# Patient Record
Sex: Female | Born: 1980 | Race: White | Hispanic: No | Marital: Single | Smoking: Former smoker
Health system: Southern US, Community
[De-identification: ages and names within clinical notes are randomized; demographics above are authoritative.]

## PROBLEM LIST (undated history)

## (undated) DIAGNOSIS — D689 Coagulation defect, unspecified: Secondary | ICD-10-CM

## (undated) DIAGNOSIS — Z720 Tobacco use: Secondary | ICD-10-CM

## (undated) DIAGNOSIS — F419 Anxiety disorder, unspecified: Secondary | ICD-10-CM

## (undated) DIAGNOSIS — T7840XA Allergy, unspecified, initial encounter: Secondary | ICD-10-CM

## (undated) DIAGNOSIS — Q400 Congenital hypertrophic pyloric stenosis: Secondary | ICD-10-CM

## (undated) DIAGNOSIS — Z5189 Encounter for other specified aftercare: Secondary | ICD-10-CM

## (undated) HISTORY — DX: Allergy, unspecified, initial encounter: T78.40XA

## (undated) HISTORY — DX: Congenital hypertrophic pyloric stenosis: Q40.0

## (undated) HISTORY — DX: Coagulation defect, unspecified: D68.9

## (undated) HISTORY — DX: Encounter for other specified aftercare: Z51.89

## (undated) HISTORY — DX: Anxiety disorder, unspecified: F41.9

## (undated) HISTORY — PX: ABDOMINAL SURGERY: SHX537

## (undated) HISTORY — DX: Tobacco use: Z72.0

---

## 2006-12-29 ENCOUNTER — Ambulatory Visit: Payer: Self-pay | Admitting: Sports Medicine

## 2006-12-29 DIAGNOSIS — M25569 Pain in unspecified knee: Secondary | ICD-10-CM

## 2007-01-24 ENCOUNTER — Ambulatory Visit: Payer: Self-pay | Admitting: Sports Medicine

## 2011-02-15 ENCOUNTER — Ambulatory Visit: Payer: Self-pay | Admitting: Physician Assistant

## 2011-04-21 ENCOUNTER — Ambulatory Visit
Admission: RE | Admit: 2011-04-21 | Discharge: 2011-04-21 | Disposition: A | Discharge status: Home / Self Care | Payer: 59 | Source: Ambulatory Visit | Attending: Family Medicine | Admitting: Family Medicine

## 2011-04-21 ENCOUNTER — Ambulatory Visit (INDEPENDENT_AMBULATORY_CARE_PROVIDER_SITE_OTHER): Payer: 59 | Admitting: Family Medicine

## 2011-04-21 VITALS — BP 112/72 | HR 85 | Temp 98.4°F | Resp 16 | Ht 66.75 in | Wt 152.2 lb

## 2011-04-21 DIAGNOSIS — N949 Unspecified condition associated with female genital organs and menstrual cycle: Secondary | ICD-10-CM

## 2011-04-21 DIAGNOSIS — R1032 Left lower quadrant pain: Secondary | ICD-10-CM

## 2011-04-21 LAB — POCT UA - MICROSCOPIC ONLY

## 2011-04-21 LAB — POCT URINALYSIS DIPSTICK
Bilirubin, UA: NEGATIVE
Ketones, UA: NEGATIVE
Leukocytes, UA: NEGATIVE
Spec Grav, UA: 1.02

## 2011-04-21 LAB — POCT URINE PREGNANCY: Preg Test, Ur: NEGATIVE

## 2011-04-21 NOTE — Progress Notes (Signed)
  Subjective:    Patient ID: Cheryl Hale, female    DOB: 05-09-1980, 31 y.o.   MRN: [id number]  Abdominal Pain This is a new problem. The current episode started today (started at [facility] The onset quality is sudden. The problem occurs constantly (worse with position change). The problem has been unchanged. The pain is located in the LLQ. The pain is at a severity of 4/10. The pain is moderate. The quality of the pain is aching. The abdominal pain does not radiate. Pertinent negatives include no constipation, dysuria, fever, hematuria or vomiting.   No history of ovarian cysts, nephrolithiasis or constipation   Review of Systems  Constitutional: Negative for fever, chills and appetite change.  Gastrointestinal: Positive for abdominal pain. Negative for vomiting and constipation.  Genitourinary: Positive for pelvic pain. Negative for dysuria, hematuria, flank pain, vaginal bleeding, vaginal discharge, vaginal pain and menstrual problem (on ocp).       Objective:   Physical Exam  Constitutional: She appears well-developed and well-nourished.  Neck: Neck supple.  Cardiovascular: Normal rate, regular rhythm and normal heart sounds.   Pulmonary/Chest: Effort normal and breath sounds normal.  Abdominal: There is tenderness (LLQ).  Genitourinary: Uterus normal. Cervix exhibits no motion tenderness. Right adnexum displays no mass. Left adnexum displays tenderness and fullness. Left adnexum displays no mass.  Skin: Skin is warm.  Psychiatric: She has a normal mood and affect.            Assessment & Plan:  (R) Lower quadrant pelvis pain, suspect ruptured cyst  Pelvic U/S; follow up after U/S complete Anticipatory guidance

## 2011-04-22 ENCOUNTER — Telehealth: Payer: Self-pay | Admitting: Family Medicine

## 2011-04-22 NOTE — Telephone Encounter (Signed)
Please see the result note.  The [place] was normal.

## 2011-04-22 NOTE — Telephone Encounter (Signed)
Pt states that she had an ultrasound yesterday and would like to know the results.  Please call.

## 2011-04-22 NOTE — Telephone Encounter (Signed)
Please review ultrasound pelvis and transvaginal results

## 2011-04-23 NOTE — Telephone Encounter (Signed)
Pt notified of result patient is feeling slightly better still in discomfort. Advised if not improving or gets worse to RTC. Pt agreed and understood.

## 2012-04-14 IMAGING — US US PELVIS COMPLETE
1 series · 14 of 25 positions shown · non-contrast
Comparison: None.

CLINICAL DATA: The left lower quadrant pain

TRANSABDOMINAL AND TRANSVAGINAL ULTRASOUND OF PELVIS
TECHNIQUE: Both transabdominal and transvaginal ultrasound
examinations of the pelvis were performed. Transabdominal technique
was performed for global imaging of the pelvis including uterus,
ovaries, adnexal regions, and pelvic cul-de-sac.

[Series 1: us pelvis complete · 0.19mm/px · 14 of 36 slices shown]
[im 1/36]
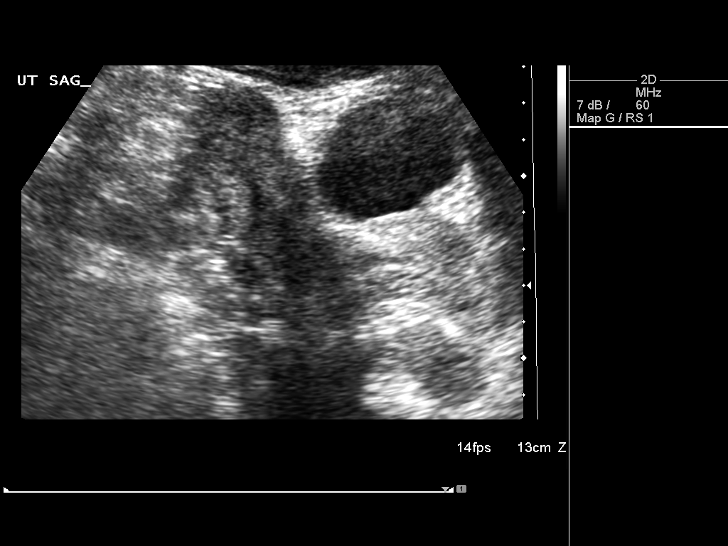
[im 3/36]
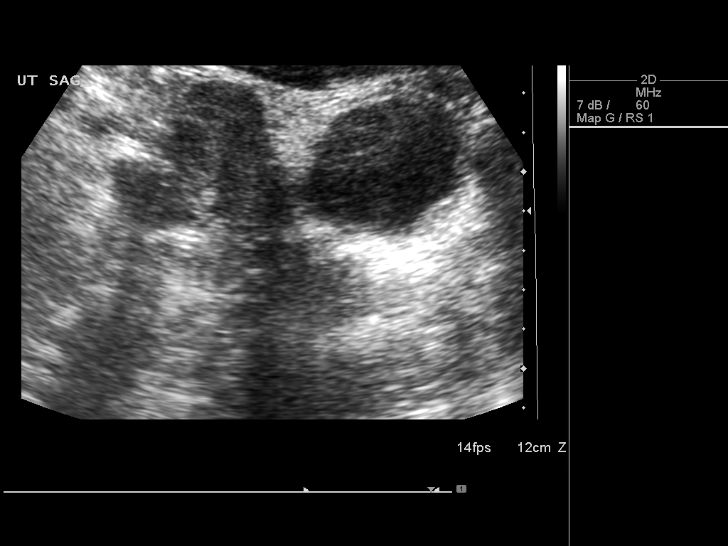
[im 6/36]
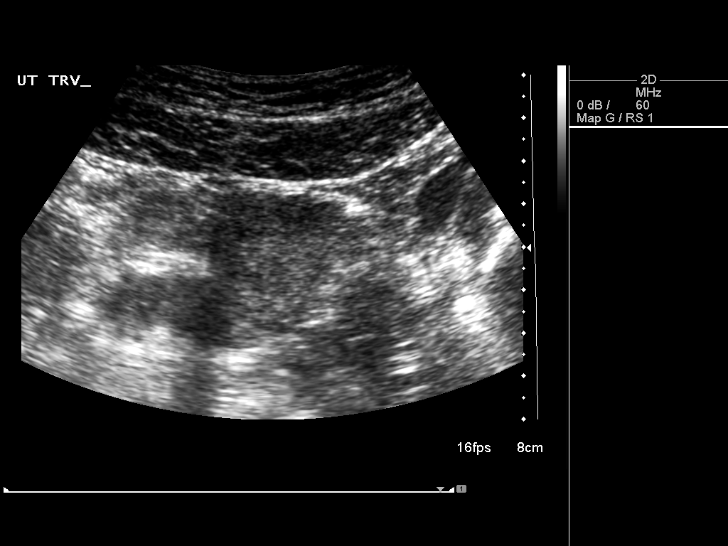
[im 9/36]
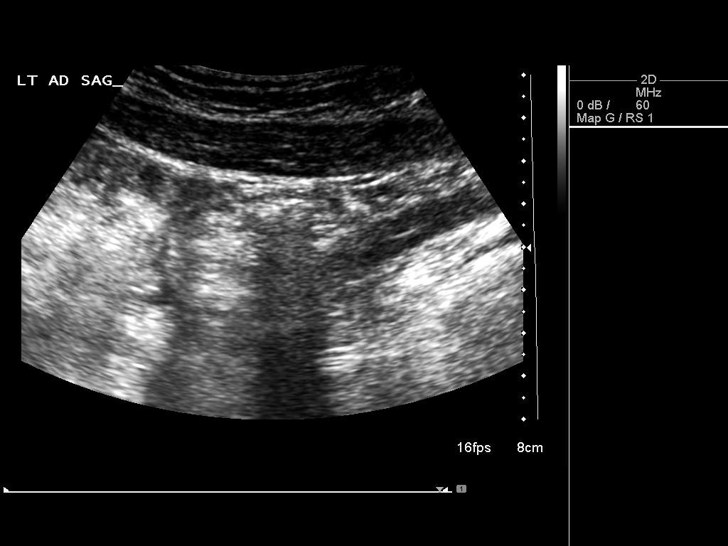
[im 12/36]
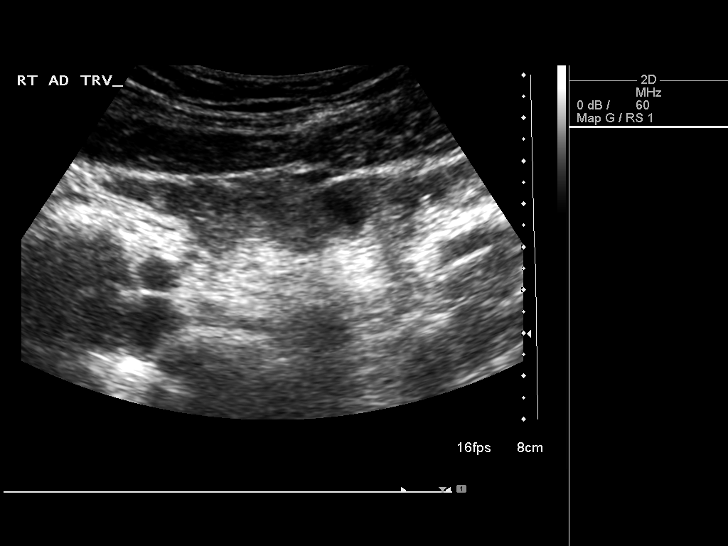
[im 14/36]
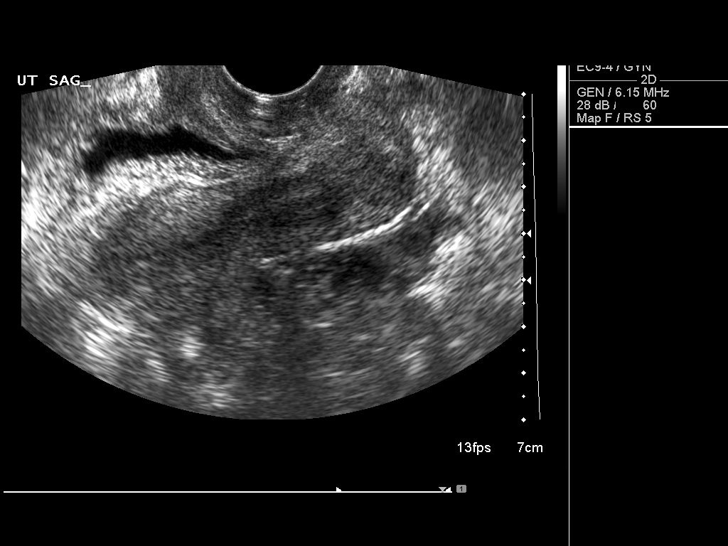
[im 17/36]
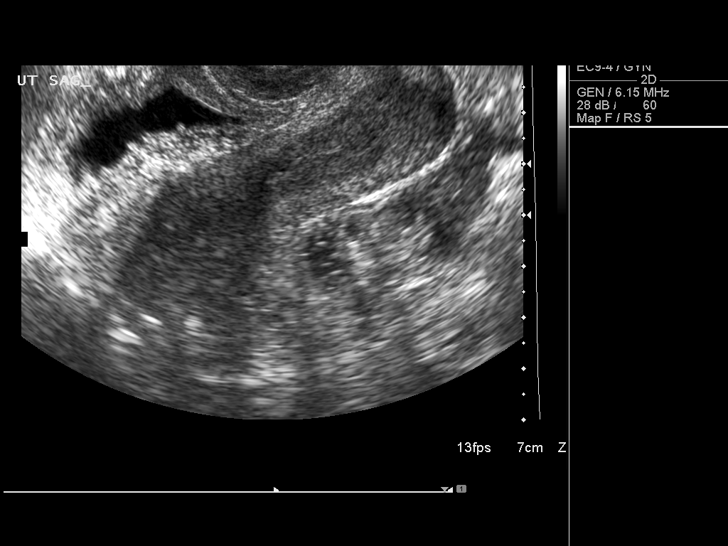
[im 19/36]
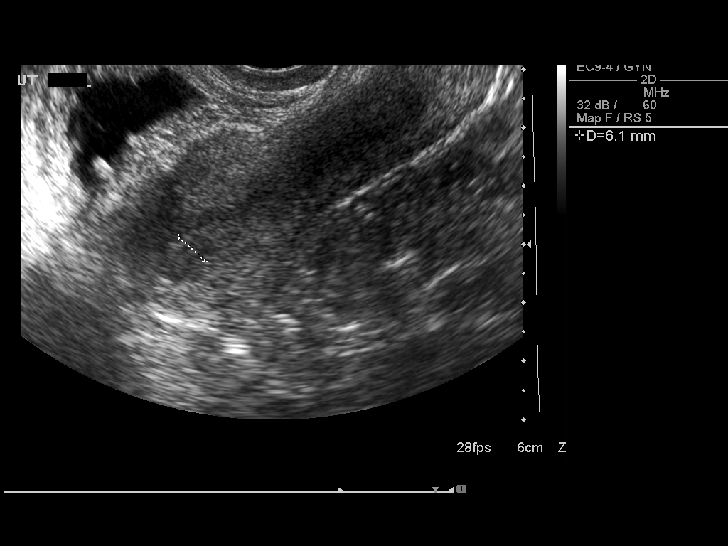
[im 22/36]
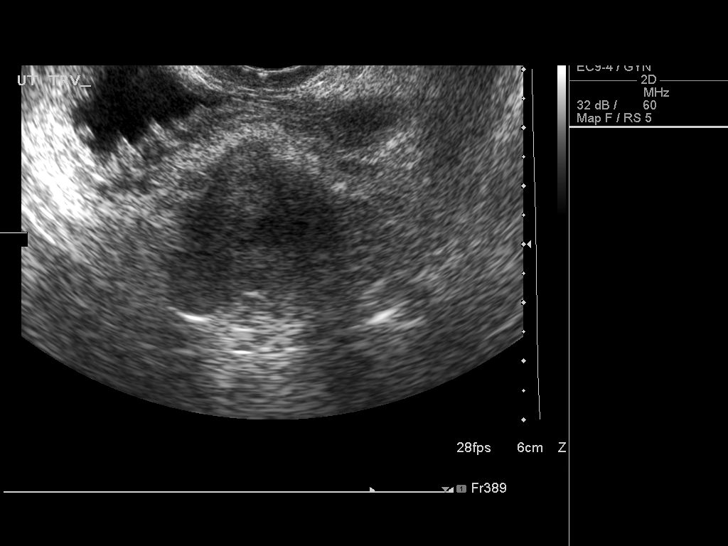
[im 24/36]
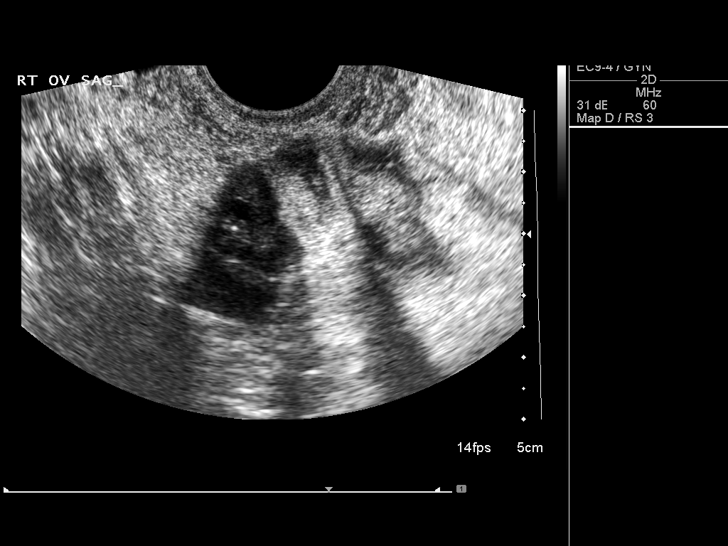
[im 27/36]
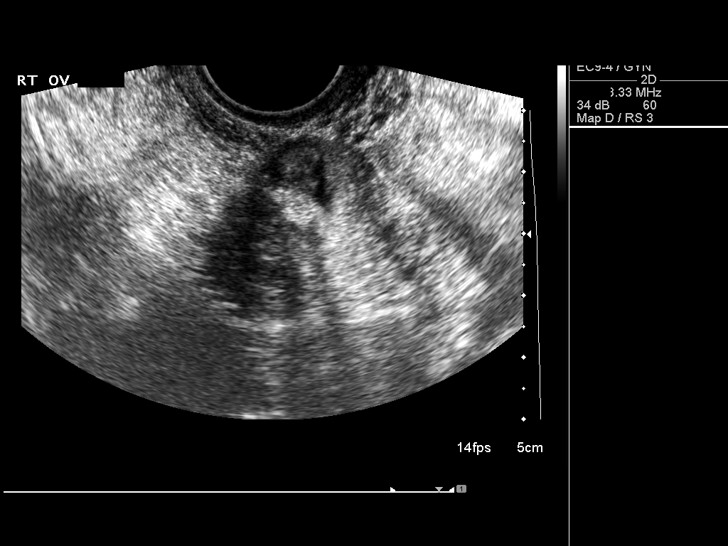
[im 30/36]
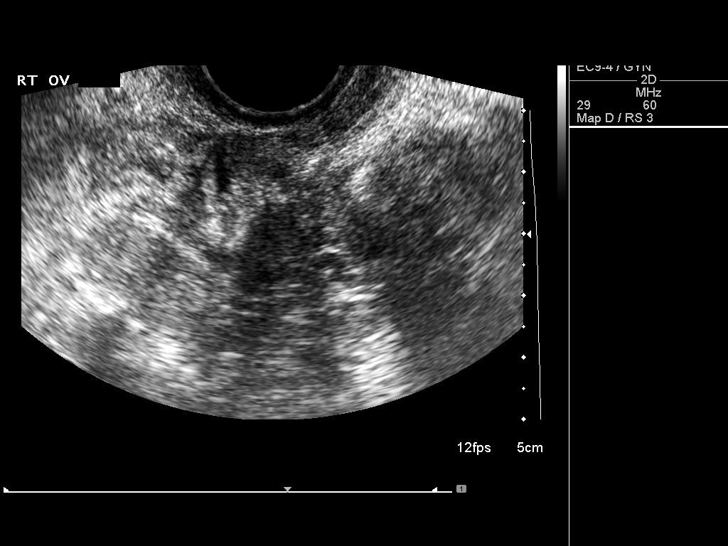
[im 33/36]
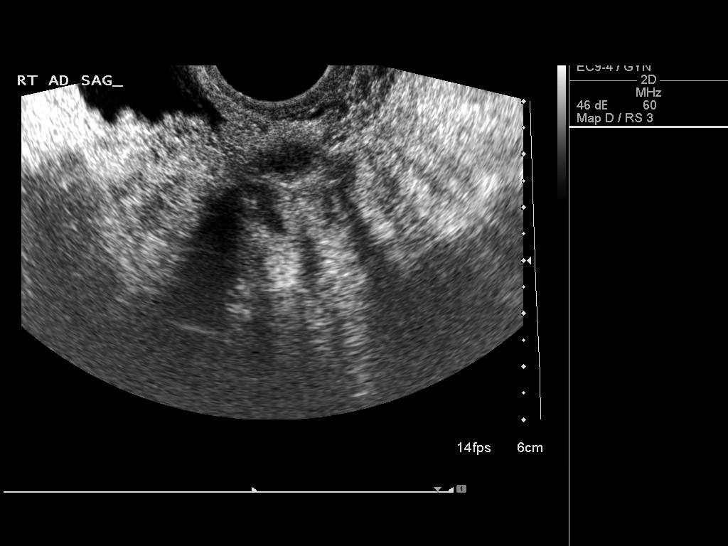
[im 36/36]
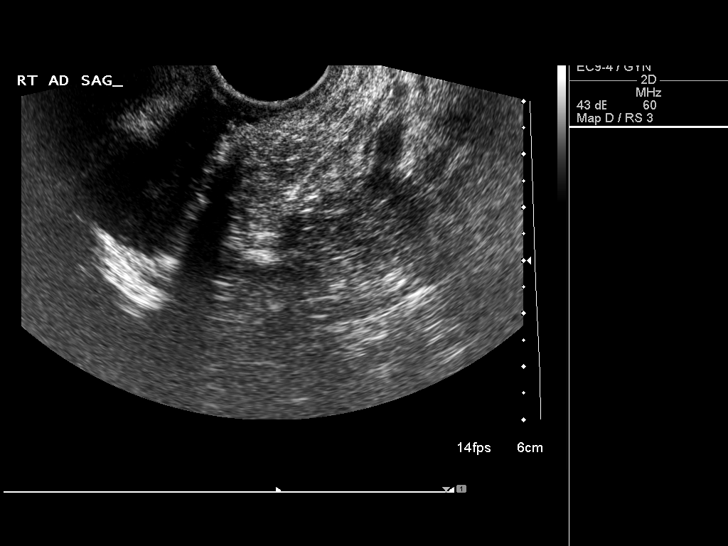

[14 of 25 positions shown; findings below may reference images not displayed]

It was necessary to proceed with endovaginal exam following the
transabdominal exam to visualize the adnexa.
FINDINGS: The uterus is normal in size and echotexture, measuring 7.8 x 2.9 x
3.0 cm.  The endometrial stripe is thin and homogeneous, measuring
6 mm in width.

The right ovary has a normal size and appearance, measuring 2.7 x
1.4 x 2.5 cm.  The left ovary is not identified.  There are no
adnexal masses or free pelvic fluid.
IMPRESSION: Normal pelvic ultrasound..

## 2012-08-22 DIAGNOSIS — J309 Allergic rhinitis, unspecified: Secondary | ICD-10-CM | POA: Insufficient documentation

## 2012-08-22 DIAGNOSIS — N943 Premenstrual tension syndrome: Secondary | ICD-10-CM | POA: Insufficient documentation

## 2012-08-22 DIAGNOSIS — D239 Other benign neoplasm of skin, unspecified: Secondary | ICD-10-CM | POA: Insufficient documentation

## 2012-08-22 DIAGNOSIS — B009 Herpesviral infection, unspecified: Secondary | ICD-10-CM | POA: Insufficient documentation

## 2012-11-07 ENCOUNTER — Ambulatory Visit (INDEPENDENT_AMBULATORY_CARE_PROVIDER_SITE_OTHER): Payer: 59 | Admitting: Family Medicine

## 2012-11-07 VITALS — BP 100/64 | HR 90 | Temp 99.2°F | Resp 18 | Ht 67.0 in | Wt 152.0 lb

## 2012-11-07 DIAGNOSIS — J020 Streptococcal pharyngitis: Secondary | ICD-10-CM

## 2012-11-07 DIAGNOSIS — J029 Acute pharyngitis, unspecified: Secondary | ICD-10-CM

## 2012-11-07 DIAGNOSIS — R509 Fever, unspecified: Secondary | ICD-10-CM

## 2012-11-07 MED ORDER — PENICILLIN V POTASSIUM 500 MG PO TABS: 500.0000 mg | ORAL_TABLET | Freq: Two times a day (BID) | ORAL | Status: DC

## 2012-11-07 MED ORDER — IBUPROFEN 100 MG/5ML PO SUSP: 400.0000 mg | Freq: Once | ORAL | Status: DC

## 2012-11-07 NOTE — Progress Notes (Signed)
[facility] [address], [place] [address] [phone]  Date:  11/07/2012   Name:  Cheryl Hale   DOB:  07-Aug-1980   MRN:  [id number]  PCP:  No PCP Per Patient    Chief Complaint: Sore Throat and Fever   History of Present Illness:  Cheryl Hale is a 32 y.o. very pleasant female patient who presents with the following:  She is here today wit ha ST. She first noted this yesterday nad today it is worse.  She feels pressure in her teeth and ears, and her cervical nodes are sore.    She has noted some possible PND, but not a cough. She was not aware of any fever- yesterdya her temp was normal at home.   She has not noted any body aches or chills, but she does feel "kind of sore.'   No GI symptoms.    She is on OCP, expects her menses later this week.    Patient Active Problem List   Diagnosis Date Noted  . 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 Allergy   . Anxiety     No past surgical history on file.  History  Substance Use Topics  . Smoking status: Current Every Day Smoker  . Smokeless tobacco: Not on file  . Alcohol Use: Not on file    Family History  Problem Relation Age of Onset  . Hypertension Mother   . Diabetes Father   . Cancer Maternal Grandfather   . Hypertension Maternal Grandfather     Allergies  Allergen Reactions  . Sulfa Antibiotics Other (See Comments)    childhood    Medication list has been reviewed and updated.  Current Outpatient Prescriptions on File Prior to Visit  Medication Sig Dispense Refill  . ALPRAZolam (XANAX) 0.5 MG tablet Take 0.5 mg by mouth as needed.      . drospirenone-ethinyl estradiol (YAZ,GIANVI,LORYNA) 3-0.02 MG tablet Take 1 tablet by mouth daily.       No current facility-administered medications on file prior to visit.    Review of Systems:  As per HPI- otherwise negative.   Physical Examination: Filed Vitals:   11/07/12 0841  BP:  100/64  Pulse: 104  Temp: 99.5 F (37.5 C)  Resp: 18   Filed Vitals:   11/07/12 0841  Height: 5\' 7"  (1.702 m)  Weight: 152 lb (68.947 kg)   Body mass index is 23.8 kg/(m^2). Ideal Body Weight: Weight in (lb) to have BMI = 25: 159.3  GEN: WDWN, NAD, Non-toxic, A & O x 3 HEENT: Atraumatic, Normocephalic. Neck supple. No masses, small tender cervical nodes palpble.  Bilateral TM wnl, oropharynx injected with mild exudate bilaterally, no abscess apparent.  Uvula midline.  PEERL,EOMI.   Ears and Nose: No external deformity. CV: RRR, No M/G/R. No JVD. No thrill. No extra heart sounds. PULM: CTA B, no wheezes, crackles, rhonchi. No retractions. No resp. distress. No accessory muscle use. ABD: S, NT, ND EXTR: No c/c/e NEURO Normal gait.  PSYCH: Normally interactive. Conversant. Not depressed or anxious appearing.  Calm demeanor.   Results for orders placed in visit on 11/07/12  POCT RAPID STREP A (OFFICE)      Result Value Range   Rapid Strep A Screen Positive (*) Negative   Given ibuprofen by mouth and tachycardia resolved   Assessment and Plan: Streptococcal sore throat - Plan: penicillin v potassium (VEETID) 500 MG tablet  Acute pharyngitis - Plan: POCT rapid strep A  Fever, unspecified - Plan: ibuprofen (ADVIL,MOTRIN) 100 MG/5ML suspension 400 mg  Treat for strep pharyngitis with penicillin.  Follow-up if not better soon, Sooner if worse.     Signed Abbe Amsterdam, MD

## 2012-11-07 NOTE — Patient Instructions (Signed)
Seen today with strep throat.  She cannot return to work or school until she has been on treatment for 24 hours.    Please let me know if you are not feeling better in the next day or two.  Drink plenty of fluids, rest, and use OTC pain relievers and throat spray as needed.

## 2012-11-16 ENCOUNTER — Ambulatory Visit: Payer: 59 | Admitting: Neurology

## 2012-12-18 ENCOUNTER — Encounter: Payer: Self-pay | Admitting: Physician Assistant

## 2012-12-18 ENCOUNTER — Ambulatory Visit (INDEPENDENT_AMBULATORY_CARE_PROVIDER_SITE_OTHER): Payer: 59 | Admitting: Physician Assistant

## 2012-12-18 ENCOUNTER — Telehealth: Payer: Self-pay | Admitting: *Deleted

## 2012-12-18 VITALS — BP 96/60 | HR 63 | Temp 98.6°F | Resp 16 | Ht 67.0 in | Wt 149.6 lb

## 2012-12-18 DIAGNOSIS — Z862 Personal history of diseases of the blood and blood-forming organs and certain disorders involving the immune mechanism: Secondary | ICD-10-CM

## 2012-12-18 DIAGNOSIS — N946 Dysmenorrhea, unspecified: Secondary | ICD-10-CM

## 2012-12-18 DIAGNOSIS — F17200 Nicotine dependence, unspecified, uncomplicated: Secondary | ICD-10-CM

## 2012-12-18 DIAGNOSIS — Z720 Tobacco use: Secondary | ICD-10-CM

## 2012-12-18 DIAGNOSIS — F411 Generalized anxiety disorder: Secondary | ICD-10-CM

## 2012-12-18 DIAGNOSIS — Z0000 Encounter for general adult medical examination without abnormal findings: Secondary | ICD-10-CM

## 2012-12-18 LAB — CBC WITH DIFFERENTIAL/PLATELET
Basophils Absolute: 0 10*3/uL (ref 0.0–0.1)
Basophils Relative: 0 % (ref 0–1)
Eosinophils Absolute: 0.1 10*3/uL (ref 0.0–0.7)
Eosinophils Relative: 1 % (ref 0–5)
HCT: 39 % (ref 36.0–46.0)
Hemoglobin: 13.4 g/dL (ref 12.0–15.0)
Lymphocytes Relative: 39 % (ref 12–46)
Lymphs Abs: 2.9 10*3/uL (ref 0.7–4.0)
MCH: 31.5 pg (ref 26.0–34.0)
MCHC: 34.4 g/dL (ref 30.0–36.0)
MCV: 91.5 fL (ref 78.0–100.0)
Monocytes Absolute: 0.6 10*3/uL (ref 0.1–1.0)
Monocytes Relative: 9 % (ref 3–12)
Neutro Abs: 3.7 10*3/uL (ref 1.7–7.7)
Neutrophils Relative %: 51 % (ref 43–77)
Platelets: 274 10*3/uL (ref 150–400)
RBC: 4.26 MIL/uL (ref 3.87–5.11)
RDW: 12.7 % (ref 11.5–15.5)
WBC: 7.3 10*3/uL (ref 4.0–10.5)

## 2012-12-18 LAB — POCT URINALYSIS DIPSTICK
Bilirubin, UA: NEGATIVE
Glucose, UA: NEGATIVE
Ketones, UA: NEGATIVE
Leukocytes, UA: NEGATIVE
Spec Grav, UA: 1.015

## 2012-12-18 LAB — COMPREHENSIVE METABOLIC PANEL WITH GFR
ALT: 13 U/L (ref 0–35)
AST: 18 U/L (ref 0–37)
Albumin: 3.3 g/dL — ABNORMAL LOW (ref 3.5–5.2)
Alkaline Phosphatase: 13 U/L — ABNORMAL LOW (ref 39–117)
BUN: 18 mg/dL (ref 6–23)
CO2: 26 meq/L (ref 19–32)
Calcium: 8.7 mg/dL (ref 8.4–10.5)
Chloride: 105 meq/L (ref 96–112)
Creat: 0.92 mg/dL (ref 0.50–1.10)
Glucose, Bld: 81 mg/dL (ref 70–99)
Potassium: 4.5 meq/L (ref 3.5–5.3)
Sodium: 139 meq/L (ref 135–145)
Total Bilirubin: 0.4 mg/dL (ref 0.3–1.2)
Total Protein: 6.7 g/dL (ref 6.0–8.3)

## 2012-12-18 LAB — POCT UA - MICROSCOPIC ONLY
Bacteria, U Microscopic: NEGATIVE
WBC, Ur, HPF, POC: NEGATIVE

## 2012-12-18 MED ORDER — DROSPIRENONE-ETHINYL ESTRADIOL 3-0.02 MG PO TABS: 1.0000 | ORAL_TABLET | Freq: Every day | ORAL | Status: DC

## 2012-12-18 MED ORDER — ALPRAZOLAM 0.5 MG PO TABS: 0.5000 mg | ORAL_TABLET | ORAL | Status: DC | PRN

## 2012-12-18 NOTE — Telephone Encounter (Signed)
Called patient, I left a message to let her know we forgot to give her the AVS and prescription. She called back and I told her to pick up  prescription/AVS at [address] today.

## 2012-12-18 NOTE — Progress Notes (Signed)
Subjective:    Patient ID: Cheryl Hale, female    DOB: [birth year], 32 y.o.   MRN: [id number]  HPI This 32 y.o. female presents for Annual Wellness Exam.  Last exam here was in 2012.  CPE in 2013 was performed by Elpidio Anis, PA-C at [facility].  As Ms. Wallace Cullens has left that practice, Ms. Steines has returned here for primary care.  Last pap 01/20/2010 was normal, and HPV at that time was negative.  Current recommendations are that we repeat both in 5 years.    Active Ambulatory Problems    Diagnosis Date Noted  . 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 Tobacco abuse 12/18/2012   Resolved Ambulatory Problems    Diagnosis Date Noted  . No Resolved Ambulatory Problems   Past Medical History  Diagnosis Date  . Allergy   . Anxiety   . Blood transfusion without reported diagnosis   . Clotting disorder   . Pyloric stenosis, congenital     Past Surgical History  Procedure Laterality Date  . Abdominal surgery  1980    pyloric stenosis    Allergies  Allergen Reactions  . Sulfa Antibiotics Other (See Comments)    childhood    Prior to Admission medications   Medication Sig Start Date End Date Taking? Authorizing Provider  ALPRAZolam Prudy Feeler) 0.5 MG tablet Take 1 tablet (0.5 mg total) by mouth as needed for anxiety. 12/18/12  Yes Verl Whitmore S Shadeed Colberg, PA-C  drospirenone-ethinyl estradiol (YAZ,GIANVI,LORYNA) 3-0.02 MG tablet Take 1 tablet by mouth daily. 12/18/12  Yes Saory Carriero S Adajah Cocking, PA-C  mometasone (NASONEX) 50 MCG/ACT nasal spray Place 2 sprays into the nose daily.   Yes Historical Provider, MD    History   Social History  . Marital Status: Single    Spouse Name: n/a    Number of Children: 0  . Years of Education: college   Occupational History  . HR Coordinator [employer]   Social History Main Topics  . Smoking status: Current Every Day Smoker -- 0.30 packs/day    Types: Cigarettes  . Smokeless tobacco: None  . Alcohol Use: 1.0 oz/week    2 drink(s) per  week  . Drug Use: No  . Sexual Activity: Not Currently   Other Topics Concern  . None   Social History Narrative   Lives alone. Exercise: kickbox 1-2 hours 4-5 times a week.    family history includes Cancer in her maternal grandfather, maternal grandmother, and paternal grandfather; Diabetes in her father; Hypertension in her maternal grandfather and mother. indicated that her mother is alive. She indicated that her father is alive. She indicated that her brother is alive. She indicated that her maternal grandmother is deceased. She indicated that her maternal grandfather is alive. She indicated that her paternal grandmother is alive. She indicated that her paternal grandfather is deceased.      Review of Systems  Constitutional: Negative.   HENT: Negative.   Eyes: Negative.   Respiratory: Negative.   Cardiovascular: Negative.   Gastrointestinal: Negative.   Endocrine: Negative.   Genitourinary: Negative.   Musculoskeletal: Negative.   Skin: Negative.   Allergic/Immunologic: Negative.   Neurological: Negative.   Hematological: Negative.   Psychiatric/Behavioral: The patient is nervous/anxious.        Objective:   Physical Exam  Vitals reviewed. Constitutional: She is oriented to person, place, and time. Vital signs are normal. She appears well-developed and well-nourished. She is active and cooperative. No distress.  HENT:  Head: Normocephalic and atraumatic.  Right Ear: Hearing, tympanic membrane, external ear and ear canal normal. No foreign bodies.  Left Ear: Hearing, tympanic membrane, external ear and ear canal normal. No foreign bodies.  Nose: Nose normal.  Mouth/Throat: Uvula is midline, oropharynx is clear and moist and mucous membranes are normal. No oral lesions. Normal dentition. No dental abscesses or uvula swelling. No oropharyngeal exudate.  Eyes: Conjunctivae, EOM and lids are normal. Pupils are equal, round, and reactive to light. Right eye exhibits no  discharge. Left eye exhibits no discharge. No scleral icterus.  Fundoscopic exam:      The right eye shows no arteriolar narrowing, no AV nicking, no exudate, no hemorrhage and no papilledema. The right eye shows red reflex.       The left eye shows no arteriolar narrowing, no AV nicking, no exudate, no hemorrhage and no papilledema. The left eye shows red reflex.  Neck: Trachea normal, normal range of motion and full passive range of motion without pain. Neck supple. No spinous process tenderness and no muscular tenderness present. No mass and no thyromegaly present.  Cardiovascular: Normal rate, regular rhythm, normal heart sounds, intact distal pulses and normal pulses.   Pulmonary/Chest: Effort normal and breath sounds normal. Right breast exhibits no inverted nipple, no mass, no nipple discharge, no skin change and no tenderness. Left breast exhibits no inverted nipple, no mass, no nipple discharge, no skin change and no tenderness. Breasts are symmetrical.  Musculoskeletal: She exhibits no edema and no tenderness.       Cervical back: Normal.       Thoracic back: Normal.       Lumbar back: Normal.  Lymphadenopathy:       Head (right side): No tonsillar, no preauricular, no posterior auricular and no occipital adenopathy present.       Head (left side): No tonsillar, no preauricular, no posterior auricular and no occipital adenopathy present.    She has no cervical adenopathy.       Right: No supraclavicular adenopathy present.       Left: No supraclavicular adenopathy present.  Neurological: She is alert and oriented to person, place, and time. She has normal strength and normal reflexes. No cranial nerve deficit. She exhibits normal muscle tone. Coordination and gait normal.  Skin: Skin is warm, dry and intact. No rash noted. She is not diaphoretic. No cyanosis or erythema. Nails show no clubbing.  Psychiatric: She has a normal mood and affect. Her speech is normal and behavior is normal.  Judgment and thought content normal.   Most recent labs are reviewed.       Assessment & Plan:  Routine general medical examination at a health care facility - Plan: CBC with Differential, Comprehensive metabolic panel, POCT UA - Microscopic Only, POCT urinalysis dipstick; Age appropriate anticipatory guidance provided.  Tobacco abuse - counseled on smoking cessation. Complicated by her work for [employer], which provides free cigarettes to their employees.  Anxiety state, unspecified - controlled/stable. Plan: ALPRAZolam (XANAX) 0.5 MG tablet  Dysmenorrhea - controlled/stable. Plan: drospirenone-ethinyl estradiol (YAZ,GIANVI,LORYNA) 3-0.02 MG tablet  History of ITP - Plan: CBC  Fernande Bras, PA-C Physician Assistant-Certified [facility]

## 2012-12-18 NOTE — Patient Instructions (Signed)

## 2012-12-19 ENCOUNTER — Other Ambulatory Visit: Payer: Self-pay | Admitting: Radiology

## 2012-12-19 NOTE — Telephone Encounter (Signed)
Clarified sig for pharmacy.

## 2012-12-20 ENCOUNTER — Encounter: Payer: Self-pay | Admitting: Physician Assistant

## 2013-03-05 ENCOUNTER — Ambulatory Visit (INDEPENDENT_AMBULATORY_CARE_PROVIDER_SITE_OTHER): Payer: 59 | Admitting: Internal Medicine

## 2013-03-05 VITALS — BP 118/68 | HR 90 | Temp 98.1°F | Resp 16 | Ht 66.75 in | Wt 152.0 lb

## 2013-03-05 DIAGNOSIS — J329 Chronic sinusitis, unspecified: Secondary | ICD-10-CM

## 2013-03-05 MED ORDER — AMOXICILLIN 500 MG PO CAPS: 1000.0000 mg | ORAL_CAPSULE | Freq: Two times a day (BID) | ORAL | Status: DC

## 2013-03-05 NOTE — Patient Instructions (Signed)

## 2013-03-05 NOTE — Progress Notes (Signed)
   Subjective:    Patient ID: Cheryl Hale, female    DOB: 05-Feb-1981, 33 y.o.   MRN: [id number]  HPI 33 y.o. Female presents to clinic with URI symptoms. Has had facial pain, sinus congestion, drainage,  Headache and sore throat (over the past 2 days) on and off since November. Has tried multiple over the counter medications for these symptoms. Denies any ear pain,fever, nausea, vomiting. Has had some body aches, but believes that to be due to a break from working out. Patient uses nasonex daily. Has had these symptoms before in the past. Has some pressure in her ears.   Review of Systems     Objective:   Physical Exam  Constitutional: She is oriented to person, place, and time. She appears well-developed.  HENT:  Head: Normocephalic.  Right Ear: External ear normal.  Left Ear: External ear normal.  Nose: Mucosal edema, rhinorrhea and sinus tenderness present. Epistaxis is observed. Right sinus exhibits frontal sinus tenderness. Right sinus exhibits no maxillary sinus tenderness. Left sinus exhibits frontal sinus tenderness. Left sinus exhibits no maxillary sinus tenderness.  Mouth/Throat: Oropharynx is clear and moist.  Eyes: EOM are normal.  Neck: Normal range of motion.  Pulmonary/Chest: Effort normal.  Neurological: She is alert and oriented to person, place, and time. No cranial nerve deficit. She exhibits normal muscle tone. Coordination normal.  Psychiatric: She has a normal mood and affect.          Assessment & Plan:  Amoxil

## 2013-03-14 ENCOUNTER — Ambulatory Visit (INDEPENDENT_AMBULATORY_CARE_PROVIDER_SITE_OTHER): Payer: 59 | Admitting: Physician Assistant

## 2013-03-14 ENCOUNTER — Encounter: Payer: Self-pay | Admitting: Physician Assistant

## 2013-03-14 VITALS — BP 94/57 | HR 77 | Temp 98.2°F | Resp 16 | Ht 67.0 in | Wt 152.0 lb

## 2013-03-14 DIAGNOSIS — F411 Generalized anxiety disorder: Secondary | ICD-10-CM

## 2013-03-14 DIAGNOSIS — M25569 Pain in unspecified knee: Secondary | ICD-10-CM

## 2013-03-14 DIAGNOSIS — L301 Dyshidrosis [pompholyx]: Secondary | ICD-10-CM

## 2013-03-14 DIAGNOSIS — M25562 Pain in left knee: Secondary | ICD-10-CM

## 2013-03-14 MED ORDER — ALPRAZOLAM 0.5 MG PO TABS: 0.5000 mg | ORAL_TABLET | ORAL | Status: DC | PRN

## 2013-03-14 NOTE — Progress Notes (Signed)
   Subjective:    Patient ID: Cheryl Hale, female    DOB: Nov 14, 1980, 33 y.o.   MRN: [id number]  PCP: No PCP Per Patient  Chief Complaint  Patient presents with  . Anxiety    consult  . Knee Pain    left knee x yesterday  . Rash    bilteral hands x 1 month    HPI 1. Very stressful events since last visit. Grandfather died unexpectedly, after a fall resulting in a cerebral hemorrhage. Former long-time boyfriend, with whom she has remained close friends, killed himself. Sister-in-law diagnosed with MS During November-December, took alprazolam almost daily, but has reduced her use again, and does not think she needs anything for daily maintenance of her anxiety.  2. Dry patch on the LEFT palm.  Then became "bumps" with a yellowy looking vibe to them.  Painful with wet.  Aunt suggested some hydrocortisone cream, which seems to have helped some.  3. "I've always had kind of bad knees."  Went back to exercising after the holidays.  Did a lot of leg work on earlier this week and noticed increased pain.  Yesterday noted that the pain was worse while climbing stairs to her appartment.  Feels like it's right under the knee cap.  Can weight bear, but has pain with stair climbing and descending.  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 Ibuprofen today has really helped.   Review of Systems As above.    Objective:   Physical Exam Blood pressure 94/57, pulse 77, temperature 98.2 F (36.8 C), resp. rate 16, height 5\' 7"  (1.702 m), weight 152 lb (68.947 kg), last menstrual period 02/15/2013, SpO2 99.00%. Body mass index is 23.8 kg/(m^2). Well-developed, well nourished WF who is awake, alert and oriented, in NAD. HEENT: [place]/AT, sclera and conjunctiva are clear.   Neck: supple, non-tender, no lymphadenopathy, thyromegaly. Heart: RRR, no murmur Lungs: normal effort, CTA Extremities: no cyanosis, clubbing or edema. Knees are normal on inspection.  FROM and no crepitance. No  tenderness of the patella, patellar tendon, joint lines or posteriorly.  No instability.  No pain with varus/valgus stress or with Apley's maneuver. Skin: warm and dry.  There is a patch of hyperkeratotic, peeling skin on the palm of the LEFT hand, near the distal head of the 3rd and 4th metacarpals. There are scattered papules, minimally erythematous, surrounding the peeling skin. Psychologic: good mood and appropriate affect, normal speech and behavior.        Assessment & Plan:  1. Dyshidrotic eczema She has Lotrisone cream at home, and will use that, or OTC hydrocortisone cream.  Dry skin care reviewed and ways to help reduce the risk of recurrence.  2. 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  Continue NSAIDS PRN and balance her exercising as she moves more gradually back into her routine.  3. Anxiety state, unspecified Stable again after recent increased situational stress. She'll advise me if her alprazolam use increases again and we will continue to reassess her use at each visit. - ALPRAZolam (XANAX) 0.5 MG tablet; Take 1 tablet (0.5 mg total) by mouth as needed for anxiety.  Dispense: 60 tablet; Refill: 0   Fara Chute, PA-C Physician Assistant-Certified [facility]

## 2013-03-14 NOTE — Patient Instructions (Signed)
Try the cream on your hand. Make sure you completely dry your hands after washing.  Wearing gloves when washing dishes.

## 2013-03-17 ENCOUNTER — Telehealth: Payer: Self-pay | Admitting: Family Medicine

## 2013-03-17 NOTE — Telephone Encounter (Signed)
Pharmacy called to get details for alprazolam RX. It just says 1 tablet as needed for anxiety. Not sure if its QD or BID. Please advise

## 2013-03-18 NOTE — Telephone Encounter (Signed)
2ND CALL - EMILY WITH [facility] CALLED, STATES CALLED YESTERDAY FOR CLARIFICATION OF MEDICATION. PT NEEDS TO GET RX FILLED AS SOON AS POSSIBLE

## 2013-03-19 NOTE — Telephone Encounter (Signed)
I believe that Willia Craze. Took care of this earlier, clarifying that the instructions are for BID prn.

## 2013-03-20 NOTE — Telephone Encounter (Signed)
Verified BID with Raquel Sarna at [facility] 03/19/13

## 2013-06-06 ENCOUNTER — Encounter: Payer: Self-pay | Admitting: Physician Assistant

## 2013-06-06 DIAGNOSIS — F419 Anxiety disorder, unspecified: Secondary | ICD-10-CM | POA: Insufficient documentation

## 2013-06-19 ENCOUNTER — Telehealth: Payer: Self-pay

## 2013-06-19 NOTE — Telephone Encounter (Signed)
Request for medical release should have been sent over by [facility] office approximately one month ago.  He is wanting for [name] to put patient on a medication such as Zoloft or Celexa.  Patient would like [name] to call her to discuss this.

## 2013-06-19 NOTE — Telephone Encounter (Signed)
Patient stated someone from another facility faxed over a recommendation for a prescription. Patient request for [name] to give her a call at work [phone]

## 2013-06-21 MED ORDER — SERTRALINE HCL 50 MG PO TABS: ORAL_TABLET | ORAL | Status: DC

## 2013-06-21 NOTE — Telephone Encounter (Signed)
Discussed with the patient. Oneida Arenas recommends an SSRI to better manage her anxiety and panic.  She has previously tried paroxetine which she didn't like because she had significant weight gain and "felt like a zombie." After discussion of potential adverse effects, and alternatives (specifically, Effexor), she decides to try sertraline.  Meds ordered this encounter  Medications  . sertraline (ZOLOFT) 50 MG tablet    Sig: 1/2 tablet (25 mg) PO daily x 1 week, then 1 tablet (50 mg) PO daily.    Dispense:  30 tablet    Refill:  3    Order Specific Question:  Supervising Provider    Answer:  DOOLITTLE, ROBERT P [[id number]

## 2013-07-01 ENCOUNTER — Ambulatory Visit (INDEPENDENT_AMBULATORY_CARE_PROVIDER_SITE_OTHER): Payer: 59 | Admitting: Family Medicine

## 2013-07-01 VITALS — BP 112/64 | HR 76 | Temp 98.2°F | Ht 66.5 in | Wt 149.2 lb

## 2013-07-01 DIAGNOSIS — M545 Low back pain, unspecified: Secondary | ICD-10-CM

## 2013-07-01 DIAGNOSIS — M62830 Muscle spasm of back: Secondary | ICD-10-CM

## 2013-07-01 DIAGNOSIS — M5380 Other specified dorsopathies, site unspecified: Secondary | ICD-10-CM

## 2013-07-01 MED ORDER — NABUMETONE 750 MG PO TABS: 750.0000 mg | ORAL_TABLET | Freq: Two times a day (BID) | ORAL | Status: DC | PRN

## 2013-07-01 MED ORDER — CYCLOBENZAPRINE HCL 10 MG PO TABS: 10.0000 mg | ORAL_TABLET | Freq: Three times a day (TID) | ORAL | Status: DC | PRN

## 2013-07-01 NOTE — Patient Instructions (Signed)

## 2013-07-03 NOTE — Progress Notes (Signed)
Subjective:    Patient ID: Cheryl Hale, female    DOB: 1981/01/06, 32 y.o.   MRN: [id number]  HPI Chief Complaint  Patient presents with  . lower back pain x 2 weeks    Pt states she does self defense classes    This chart was scribed for Delman Cheadle, MD by Thea Alken, ED Scribe. This patient was seen in room 8 and the patient's care was started at 4:36 PM.  HPI Comments: Cheryl Hale is a 33 y.o. female who presents to the [facility] complaining of low back pain onset 2 weeks prev. Pt reports she goes to gym and takes self defense 5 times a week and that 2 weeks ago during class her back felt tight. She reports the next day she could not sit up straight and that she did not go to class for the rest of that week. She reports going back to self defense 4 days ago for the first time but woke up with discomfort in her back again. She describes pain as tight, stiff and uncomfortable. She reports shooting pain that radiates to bilateral flank. She reports she is unable to bend forward but finds standing most comfortable. Pt reports trying otc medication patch, sleeping with a heating pad, taking aleve, and using bayer back and body w/o sig relief. She reports that she is unable to get comfortable during the night.  Pt denies weakness and numbness in legs. Pt denies bladder and bowel incontinence.Pt denies h/o back pain or known injury other than with heavy lifting at the gym   Past Medical History  Diagnosis Date  . Allergy   . Anxiety   . Blood transfusion without reported diagnosis   . Tobacco abuse   . Pyloric stenosis, congenital   . Clotting disorder     ITP   Allergies  Allergen Reactions  . Sulfa Antibiotics Other (See Comments)    childhood   Prior to Admission medications   Medication Sig Start Date End Date Taking? Authorizing Provider  ALPRAZolam Duanne Moron) 0.5 MG tablet Take 1 tablet (0.5 mg total) by mouth as needed for anxiety. 03/14/13  Yes [name] Janalee Dane, PA-C  clotrimazole-betamethasone (LOTRISONE) cream  02/05/13  Yes Historical Provider, MD  drospirenone-ethinyl estradiol (YAZ,GIANVI,LORYNA) 3-0.02 MG tablet Take 1 tablet by mouth daily. 12/18/12  Yes [name] S Jeffery, PA-C  mometasone (NASONEX) 50 MCG/ACT nasal spray Place 2 sprays into the nose daily.   Yes Historical Provider, MD  sertraline (ZOLOFT) 50 MG tablet 1/2 tablet (25 mg) PO daily x 1 week, then 1 tablet (50 mg) PO daily. 06/21/13  Yes [name] Janalee Dane, PA-C    Review of Systems  Constitutional: Negative for fever.  Gastrointestinal: Negative for nausea, vomiting, diarrhea, constipation and blood in stool.  Genitourinary: Negative for urgency, enuresis, difficulty urinating and pelvic pain.  Musculoskeletal: Positive for back pain and myalgias. Negative for arthralgias, joint swelling, neck pain and neck stiffness.  Neurological: Negative for weakness and numbness.  Psychiatric/Behavioral: Positive for sleep disturbance.      BP 112/64  Pulse 76  Temp(Src) 98.2 F (36.8 C) (Oral)  Ht 5' 6.5" (1.689 m)  Wt 149 lb 3.2 oz (67.677 kg)  BMI 23.72 kg/m2  SpO2 98%  LMP 06/03/2013 Objective:   Physical Exam  Nursing note and vitals reviewed. Constitutional: She is oriented to person, place, and time. She appears well-developed and well-nourished. No distress.  HENT:  Head: Normocephalic and atraumatic.  Eyes: EOM are normal.  Neck: Neck supple. No tracheal deviation present.  Cardiovascular: Normal rate.   Pulmonary/Chest: Effort normal. No respiratory distress.  Musculoskeletal:       Thoracic back: Normal. She exhibits normal range of motion.       Lumbar back: She exhibits decreased range of motion, pain and spasm. She exhibits no tenderness, no bony tenderness, no swelling and no deformity.  Lumbar Flexion reduced to about 75 degrees And lumbar lateral rotation reduced to about 45 degrees bilaterally. Lumbar extension nml. No point tenderness over lumbar  spinous process or paraspinal muscles  Neurological: She is alert and oriented to person, place, and time. She has normal strength. She displays no tremor. No cranial nerve deficit or sensory deficit. She exhibits normal muscle tone. Coordination and gait normal.  Reflex Scores:      Patellar reflexes are 0 on the right side and 1+ on the left side. VERY difficult to elicit any upper or lower ext reflexes (pt states that that is baseline for her). strength 5/5 throughout bilateral lower extremities  Skin: Skin is warm and dry.  Psychiatric: She has a normal mood and affect. Her behavior is normal.      Assessment & Plan:   4:36 PM- Pt was told to have muscle spasm to lower back. Discussed treatment plan with pt which includes an antiinflammatory, muscle relaxer as well as stretching and massaging lower back and pt agreed to plan. If cont, RTC for further eval and can cons xray at that time. Lumbago  Muscle spasm of back  Meds ordered this encounter  Medications  . cyclobenzaprine (FLEXERIL) 10 MG tablet    Sig: Take 1 tablet (10 mg total) by mouth 3 (three) times daily as needed for muscle spasms.    Dispense:  30 tablet    Refill:  0  . nabumetone (RELAFEN) 750 MG tablet    Sig: Take 1 tablet (750 mg total) by mouth 2 (two) times daily as needed for moderate pain.    Dispense:  60 tablet    Refill:  0    I personally performed the services described in this documentation, which was scribed in my presence. The recorded information has been reviewed and considered, and addended by me as needed.  Delman Cheadle, MD MPH

## 2013-07-25 ENCOUNTER — Encounter: Payer: Self-pay | Admitting: Physician Assistant

## 2013-07-25 ENCOUNTER — Ambulatory Visit (INDEPENDENT_AMBULATORY_CARE_PROVIDER_SITE_OTHER): Payer: 59 | Admitting: Physician Assistant

## 2013-07-25 VITALS — BP 110/64 | HR 74 | Temp 99.0°F | Resp 18 | Ht 66.5 in | Wt 150.0 lb

## 2013-07-25 DIAGNOSIS — L259 Unspecified contact dermatitis, unspecified cause: Secondary | ICD-10-CM

## 2013-07-25 DIAGNOSIS — L309 Dermatitis, unspecified: Secondary | ICD-10-CM

## 2013-07-25 DIAGNOSIS — L301 Dyshidrosis [pompholyx]: Secondary | ICD-10-CM

## 2013-07-25 DIAGNOSIS — F411 Generalized anxiety disorder: Secondary | ICD-10-CM

## 2013-07-25 MED ORDER — CLOBETASOL PROPIONATE 0.05 % EX OINT: 1.0000 "application " | TOPICAL_OINTMENT | Freq: Two times a day (BID) | CUTANEOUS | Status: DC

## 2013-07-25 MED ORDER — CLOTRIMAZOLE-BETAMETHASONE 1-0.05 % EX CREA: TOPICAL_CREAM | Freq: Every day | CUTANEOUS | Status: DC

## 2013-07-25 NOTE — Progress Notes (Signed)
   Subjective:    Patient ID: Cheryl Hale, female    DOB: 12-Jul-1980, 33 y.o.   MRN: [id number]   PCP: Makena Mcgrady, PA-C  Chief Complaint  Patient presents with  . Follow-up    5 week    Medications, allergies, past medical history, surgical history, family history, social history and problem list reviewed and updated.  HPI  Presents for re-evaluation of anxiety after starting Sertraline. Continues to see Oneida Arenas.  She notes a decrease in her panic attacks, but they still occur. She's only taking 25 mg of sertraline, which she tolerates without adverse effects.  She likes that she doesn't "feel like a zombie."  Worried that she will feel like a zombie if she increases the dose. No thoughts of harming herself or others.  Review of Systems As above. She has also noted worsening rash on her LEFT hand.  She previously tried applying Lotrisone, but without benefit. She uses the Lotrisone for dermatitis on the face (nasal bridge) 5 days at a time, but it recurs every time she stops it. She has not seen dermatology for this.    Objective:   Physical Exam  Constitutional: She is oriented to person, place, and time. Vital signs are normal. She appears well-developed and well-nourished. No distress.  HENT:  Head: Normocephalic and atraumatic.  Right Ear: Hearing normal.  Left Ear: Hearing normal.  Eyes: Conjunctivae and EOM are normal. Pupils are equal, round, and reactive to light.  Neck: Normal range of motion and phonation normal. Neck supple. No mass and no thyromegaly present.  Cardiovascular: Normal rate, regular rhythm and normal heart sounds.   Pulmonary/Chest: Effort normal and breath sounds normal.  Lymphadenopathy:    She has no cervical adenopathy.  Neurological: She is alert and oriented to person, place, and time.  Skin: Skin is warm, dry and intact. Rash noted. No ecchymosis and no laceration noted. Rash is maculopapular (palm of the LEFT hand, in 3 discrete  patches in various stages of development and resolution.).  Psychiatric: She has a normal mood and affect. Her speech is normal and behavior is normal. Judgment and thought content normal. Cognition and memory are normal. She expresses no suicidal ideation.          Assessment & Plan:  1. Anxiety state, unspecified Continue sertraline.  Encouraged to try increasing to the 50 mg dose, and reminded that she can reduce it again if she has adverse effects.  2. Eczema, dyshidrotic Counseled on skin hygiene. - clobetasol ointment (TEMOVATE) 0.05 %; Apply 1 application topically 2 (two) times daily.  Dispense: 30 g; Refill: 0  3. Dermatitis of face I'm not comfortable with her using steroid cream on her face daily without dermatology evaluation. - clotrimazole-betamethasone (LOTRISONE) cream; Apply topically daily.  Dispense: 30 g; Refill: 0 - Ambulatory referral to Dermatology  Return for complete physical in October, as planned.Fara Chute, PA-C Physician Assistant-Certified [facility]

## 2013-07-25 NOTE — Patient Instructions (Signed)
If you have not heard anything regarding the referral in 1 week, please contact our office.  Increase the sertraline to 50 mg.  If you have side effects that you don't like, go back to 25 mg.

## 2013-09-18 ENCOUNTER — Ambulatory Visit (INDEPENDENT_AMBULATORY_CARE_PROVIDER_SITE_OTHER): Payer: 59 | Admitting: Family Medicine

## 2013-09-18 VITALS — BP 108/68 | HR 75 | Temp 98.3°F | Resp 16 | Ht 66.0 in | Wt 153.4 lb

## 2013-09-18 DIAGNOSIS — M542 Cervicalgia: Secondary | ICD-10-CM

## 2013-09-18 DIAGNOSIS — M62838 Other muscle spasm: Secondary | ICD-10-CM

## 2013-09-18 MED ORDER — HYDROCODONE-ACETAMINOPHEN 5-325 MG PO TABS: 1.0000 | ORAL_TABLET | Freq: Four times a day (QID) | ORAL | Status: DC | PRN

## 2013-09-18 MED ORDER — CYCLOBENZAPRINE HCL 5 MG PO TABS: 5.0000 mg | ORAL_TABLET | Freq: Three times a day (TID) | ORAL | Status: DC | PRN

## 2013-09-18 NOTE — Patient Instructions (Signed)
Ok to continue Nabumetone twice per day, and flexeril up to 10mg  three times per day (new dose is 5mg  - can take 1-2 at a time). If needed for pain - can use hydrocodone, but be careful using this while taking flexeril.  Recheck in next 3 days of not improving.  Return to the clinic or go to the nearest emergency room if any of your symptoms worsen or new symptoms occur.

## 2013-09-18 NOTE — Progress Notes (Signed)
Subjective:  This chart was scribed for Wendie Agreste, MD by Jeanell Sparrow, ED Scribe. This patient was seen in room 1 and the patient's care was started at 5:53 PM.    Patient ID: Cheryl Hale, female    DOB: 10-Nov-1980, 33 y.o.   MRN: [id number]  HPI HPI Comments: Cheryl Hale is a 33 y.o. female who presents to the Emergency Department complaining of constant moderate neck stiffness and pain that started 4 days ago.  She states that she noticed "cricking" in her neck about 4 days ago. She states that 3 days ago the feeling got worse. She states that she used a heating pad with no relief. She states that the neck pain radiates to the back of her head. She reports no prior hx of neck pain.   She reports some subjective intermittent fever. She reports no new headache, just usual headache with seasonal allergies. She states that she has no problems with loud sounds or bright lights.   She states that she took Naubumetone 750mg  once this morning along with cyclobenzaprine once with no relief.   She reports no neck injury. She states that she started a new exercise program two weeks ago but does not recall any injury.  She states that she has some pain in the back of neck with swallowing. She states that she is clearing secretions and swallowing ok.  Patient Active Problem List   Diagnosis Date Noted  . Anxiety 06/06/2013  . Tobacco abuse 12/18/2012  . 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 Allergy   . Anxiety   . Blood transfusion without reported diagnosis   . Tobacco abuse   . Pyloric stenosis, congenital   . Clotting disorder     ITP   Past Surgical History  Procedure Laterality Date  . Abdominal surgery  1980    pyloric stenosis   Allergies  Allergen Reactions  . Sulfa Antibiotics Other (See Comments)    childhood   Prior to Admission medications   Medication Sig Start Date End Date Taking?  Authorizing Provider  ALPRAZolam Duanne Moron) 0.5 MG tablet Take 1 tablet (0.5 mg total) by mouth as needed for anxiety. 03/14/13  Yes [name] S Jeffery, PA-C  clobetasol ointment (TEMOVATE) 6.19 % Apply 1 application topically 2 (two) times daily. 07/25/13  Yes [name] S Jeffery, PA-C  clotrimazole-betamethasone (LOTRISONE) cream Apply topically daily. 07/25/13  Yes [name] S Jeffery, PA-C  drospirenone-ethinyl estradiol (YAZ,GIANVI,LORYNA) 3-0.02 MG tablet Take 1 tablet by mouth daily. 12/18/12  Yes [name] S Jeffery, PA-C  mometasone (NASONEX) 50 MCG/ACT nasal spray Place 2 sprays into the nose daily.   Yes Historical Provider, MD  sertraline (ZOLOFT) 50 MG tablet 1/2 tablet (25 mg) PO daily x 1 week, then 1 tablet (50 mg) PO daily. 06/21/13  Yes [name] S Jeffery, PA-C  cyclobenzaprine (FLEXERIL) 10 MG tablet Take 1 tablet (10 mg total) by mouth 3 (three) times daily as needed for muscle spasms. 07/01/13   Shawnee Knapp, MD  nabumetone (RELAFEN) 750 MG tablet Take 1 tablet (750 mg total) by mouth 2 (two) times daily as needed for moderate pain. 07/01/13   Shawnee Knapp, MD   History   Social History  . Marital Status: Single    Spouse Name: n/a    Number of Children: 0  . Years of Education: college   Occupational History  . HR Coordinator [employer]  Social History Main Topics  . Smoking status: Current Every Day Smoker -- 0.30 packs/day    Types: Cigarettes  . Smokeless tobacco: Never Used  . Alcohol Use: 1.0 oz/week    2 drink(s) per week  . Drug Use: No  . Sexual Activity: Not Currently   Other Topics Concern  . Not on file   Social History Narrative   Lives alone. Exercise: kickbox 1-2 hours 4-5 times a week.     Review of Systems  Constitutional: Positive for fever.  Eyes: Negative for photophobia.  Musculoskeletal: Positive for neck pain and neck stiffness.  Neurological: Negative for headaches (no new HA).       Objective:   Physical Exam  Nursing note and vitals  reviewed. Constitutional: She is oriented to person, place, and time. She appears well-developed and well-nourished. No distress.  HENT:  Head: Normocephalic and atraumatic.  Mouth/Throat: Oropharynx is clear and moist.  Neck: Neck supple. No tracheal deviation present.  C-spine: Only able to extend 10-15 degrees.  Left rotation 10-15 degrees. 10-15 right rotation. Guarded with minimal right lateral flexion, 30 degrees of left lateral flexion. Brachial plexus is non tender.  Right lower paraspinals and right trapezius tenderness. No midline or bony tenderness.   Cardiovascular: Normal rate, regular rhythm and normal heart sounds.  Exam reveals no gallop and no friction rub.   No murmur heard. Pulmonary/Chest: Effort normal and breath sounds normal. No respiratory distress. She has no wheezes. She has no rales. She exhibits no tenderness.  Musculoskeletal: Normal range of motion.  Neurological: She is alert and oriented to person, place, and time.  Reflex Scores:      Tricep reflexes are 2+ on the right side and 2+ on the left side.      Bicep reflexes are 2+ on the right side and 2+ on the left side.      Brachioradialis reflexes are 2+ on the right side and 2+ on the left side. Equal grip and upper extremity strength bilaterally.   Skin: Skin is warm and dry. No rash noted.  Psychiatric: She has a normal mood and affect. Her behavior is normal.      Filed Vitals:   09/18/13 1724  BP: 108/68  Pulse: 75  Temp: 98.3 F (36.8 C)  TempSrc: Oral  Resp: 16  Height: 5\' 6"  (1.676 m)  Weight: 153 lb 6.4 oz (69.582 kg)  SpO2: 98%       Assessment & Plan:   Cheryl Hale is a 33 y.o. 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  Occipital neuralgia also in DDX. Discussed XR, but deferred for few days if not improving with flexeril and Relafen trial. RTC/ER sooner if worse. If needed for more severe pain, short course of hydrocodone, but SED and cautioned against combining with flexeril.   Meds ordered this encounter  Medications  . cyclobenzaprine (FLEXERIL) 5 MG tablet    Sig: Take 1-2 tablets (5-10 mg total) by mouth 3 (three) times daily as needed.    Dispense:  30 tablet    Refill:  0  . HYDROcodone-acetaminophen (NORCO/VICODIN) 5-325 MG per tablet    Sig: Take 1 tablet by mouth every 6 (six) hours as needed for moderate pain.    Dispense:  15 tablet    Refill:  0   Patient Instructions  Ok to continue Nabumetone twice per day, and flexeril up to 10mg  three times per day (new dose is 5mg  - can take 1-2 at a time). If needed for pain - can use hydrocodone, but be careful using this while taking flexeril.  Recheck in next 3 days of not improving.  Return to the clinic or go to the nearest emergency room if any of your symptoms worsen or new symptoms occur.     I personally performed the services described in this documentation, which was scribed in my presence. The recorded information has been reviewed and considered, and addended by me as needed.

## 2013-10-25 ENCOUNTER — Telehealth: Payer: Self-pay

## 2013-10-25 NOTE — Telephone Encounter (Signed)
Refill on sertraline (ZOLOFT) 50 MG tablet [[id number], would like to know if she could be prescribed a 40mo rx.? She has an appointment with [name] in September, but does not think her meds will last till then.

## 2013-10-29 ENCOUNTER — Other Ambulatory Visit: Payer: Self-pay | Admitting: Physician Assistant

## 2013-10-29 MED ORDER — SERTRALINE HCL 50 MG PO TABS: ORAL_TABLET | ORAL | Status: DC

## 2013-11-29 ENCOUNTER — Encounter: Payer: Self-pay | Admitting: Family Medicine

## 2013-11-29 ENCOUNTER — Ambulatory Visit (INDEPENDENT_AMBULATORY_CARE_PROVIDER_SITE_OTHER): Payer: 59 | Admitting: Family Medicine

## 2013-11-29 VITALS — BP 98/62 | HR 67 | Temp 98.0°F | Resp 16 | Ht 66.75 in | Wt 153.0 lb

## 2013-11-29 DIAGNOSIS — L301 Dyshidrosis [pompholyx]: Secondary | ICD-10-CM

## 2013-11-29 DIAGNOSIS — Z1321 Encounter for screening for nutritional disorder: Secondary | ICD-10-CM

## 2013-11-29 DIAGNOSIS — F419 Anxiety disorder, unspecified: Secondary | ICD-10-CM

## 2013-11-29 DIAGNOSIS — Z136 Encounter for screening for cardiovascular disorders: Secondary | ICD-10-CM

## 2013-11-29 DIAGNOSIS — F411 Generalized anxiety disorder: Secondary | ICD-10-CM

## 2013-11-29 DIAGNOSIS — Z1389 Encounter for screening for other disorder: Secondary | ICD-10-CM

## 2013-11-29 DIAGNOSIS — Z720 Tobacco use: Secondary | ICD-10-CM

## 2013-11-29 DIAGNOSIS — Z1383 Encounter for screening for respiratory disorder NEC: Secondary | ICD-10-CM

## 2013-11-29 DIAGNOSIS — Z130 Encounter for screening for diseases of the blood and blood-forming organs and certain disorders involving the immune mechanism: Secondary | ICD-10-CM

## 2013-11-29 DIAGNOSIS — Z0000 Encounter for general adult medical examination without abnormal findings: Secondary | ICD-10-CM

## 2013-11-29 LAB — CBC WITH DIFFERENTIAL/PLATELET
BASOS PCT: 0 % (ref 0–1)
Basophils Absolute: 0 10*3/uL (ref 0.0–0.1)
EOS ABS: 0.1 10*3/uL (ref 0.0–0.7)
EOS PCT: 2 % (ref 0–5)
HCT: 37.8 % (ref 36.0–46.0)
Hemoglobin: 13 g/dL (ref 12.0–15.0)
Lymphocytes Relative: 41 % (ref 12–46)
Lymphs Abs: 2.8 10*3/uL (ref 0.7–4.0)
MCH: 31.8 pg (ref 26.0–34.0)
MCHC: 34.4 g/dL (ref 30.0–36.0)
MCV: 92.4 fL (ref 78.0–100.0)
Monocytes Absolute: 0.5 10*3/uL (ref 0.1–1.0)
Monocytes Relative: 8 % (ref 3–12)
NEUTROS PCT: 49 % (ref 43–77)
Neutro Abs: 3.3 10*3/uL (ref 1.7–7.7)
PLATELETS: 255 10*3/uL (ref 150–400)
RBC: 4.09 MIL/uL (ref 3.87–5.11)
RDW: 13.1 % (ref 11.5–15.5)
WBC: 6.8 10*3/uL (ref 4.0–10.5)

## 2013-11-29 LAB — COMPREHENSIVE METABOLIC PANEL
ALBUMIN: 3 g/dL — AB (ref 3.5–5.2)
ALK PHOS: 11 U/L — AB (ref 39–117)
ALT: 8 U/L (ref 0–35)
AST: 18 U/L (ref 0–37)
BILIRUBIN TOTAL: 0.4 mg/dL (ref 0.2–1.2)
BUN: 16 mg/dL (ref 6–23)
CO2: 26 meq/L (ref 19–32)
Calcium: 8.7 mg/dL (ref 8.4–10.5)
Chloride: 104 mEq/L (ref 96–112)
Creat: 0.82 mg/dL (ref 0.50–1.10)
GLUCOSE: 81 mg/dL (ref 70–99)
POTASSIUM: 4.5 meq/L (ref 3.5–5.3)
SODIUM: 136 meq/L (ref 135–145)
TOTAL PROTEIN: 6.2 g/dL (ref 6.0–8.3)

## 2013-11-29 LAB — LIPID PANEL
CHOLESTEROL: 183 mg/dL (ref 0–200)
HDL: 86 mg/dL (ref >39)
LDL Cholesterol: 74 mg/dL (ref 0–99)
Total CHOL/HDL Ratio: 2.1 Ratio
Triglycerides: 114 mg/dL (ref <150)
VLDL: 23 mg/dL (ref 0–40)

## 2013-11-29 LAB — TSH: TSH: 2.347 u[IU]/mL (ref 0.350–4.500)

## 2013-11-29 MED ORDER — CLOBETASOL PROPIONATE 0.05 % EX OINT: 1.0000 "application " | TOPICAL_OINTMENT | Freq: Two times a day (BID) | CUTANEOUS | Status: DC

## 2013-11-29 MED ORDER — SERTRALINE HCL 50 MG PO TABS: 100.0000 mg | ORAL_TABLET | Freq: Every day | ORAL | Status: DC

## 2013-11-29 MED ORDER — DROSPIRENONE-ETHINYL ESTRADIOL 3-0.02 MG PO TABS: 1.0000 | ORAL_TABLET | Freq: Every day | ORAL | Status: DC

## 2013-11-29 MED ORDER — ALPRAZOLAM 0.5 MG PO TABS: 0.5000 mg | ORAL_TABLET | ORAL | Status: DC | PRN

## 2013-11-29 NOTE — Progress Notes (Signed)
   Subjective:    Patient ID: Cheryl Hale, female    DOB: 10/22/1980, 33 y.o.   MRN: [id number]  HPI    Review of Systems  Constitutional: Negative.   HENT: Negative.   Eyes: Negative.   Respiratory: Negative.   Cardiovascular: Negative.   Gastrointestinal: Negative.   Endocrine: Negative.   Genitourinary: Negative.   Musculoskeletal: Negative.   Skin: Negative.   Allergic/Immunologic: Positive for environmental allergies.  Neurological: Negative.   Hematological: Negative.   Psychiatric/Behavioral: Negative.        Objective:   Physical Exam        Assessment & Plan:

## 2013-11-29 NOTE — Patient Instructions (Signed)

## 2013-11-29 NOTE — Progress Notes (Signed)
Subjective:    Patient ID: Cheryl Hale, female    DOB: Aug 23, 1980, 33 y.o.   MRN: [id number] This chart was scribed for Cheryl Cheadle, MD by Cheryl Hale, Medical Scribe. This patient was seen in Room 28 and the patient's care was started at 10:56 AM.  Chief Complaint  Patient presents with  . Annual Exam    HPI HPI Comments: Cheryl Hale is a 33 y.o. female who presents to the [facility] for an annual exam.  Health Maintenance: She had a physical 1 year ago done by her PCP, Cheryl Hale. Her last pap smear was November of 2011 with a negative HPV, and is due for next year.  Reproductive: She is taking Yaz oral contraceptive pills and has been on them for a long time. She does not have any problems with the pills and requests a prescription refill.    Social History: Patient is a current 1-3 cigarette a day smoker, and her work for [employer] provides free cigarettes. She started a new job and also moved recently. She says the new job is stressful, but says she enjoys it. She works for the same company, but at a higher position now.  Skin: Patient has a history of Psoriasis, eczema.  HENT: Patient is taking Nasonex.   Anxiety: She has been taking Zoloft tablets for anxiety since 3 months ago and it has been working well so far. Patient denies side effects, including trouble sleeping, nausea, and jitteriness. She also takes Xanax as needed for panic attacks, but patient reports not having any panic attacks for the past 2 months.  Psychiatric: She denies depression, but she notes anhedonia and decreased sex drive. She has been sleeping well, getting about 7-8 hours per night.  Immunization: Patient had her last tetanus shot in July of 2011. Her flu shot was done 3 days ago.  She denies urinary symptoms, bowel symptoms, and vaginal discharge.  Past Medical History  Diagnosis Date  . Allergy   . Anxiety   . Blood transfusion without reported diagnosis     . Tobacco abuse   . Pyloric stenosis, congenital   . Clotting disorder     ITP   Past Surgical History  Procedure Laterality Date  . Abdominal surgery  1980    pyloric stenosis   Current Outpatient Prescriptions on File Prior to Visit  Medication Sig Dispense Refill  . mometasone (NASONEX) 50 MCG/ACT nasal spray Place 2 sprays into the nose daily.       No current facility-administered medications on file prior to visit.   Allergies  Allergen Reactions  . Sulfa Antibiotics Other (See Comments)    childhood   Family History  Problem Relation Age of Onset  . Hypertension Mother   . Diabetes Father   . Cancer Maternal Grandfather   . Hypertension Maternal Grandfather   . Cancer Maternal Grandmother   . Cancer Paternal Grandfather     lymphoma   History   Social History  . Marital Status: Single    Spouse Name: n/a    Number of Children: 0  . Years of Education: college   Occupational History  . HR Coordinator [employer]   Social History Main Topics  . Smoking status: Current Every Day Smoker -- 0.30 packs/day    Types: Cigarettes  . Smokeless tobacco: Never Used  . Alcohol Use: 1.0 oz/week    2 drink(s) per week  . Drug Use: No  .  Sexual Activity: Not Currently   Other Topics Concern  . None   Social History Narrative   Lives alone. Exercise: kickbox 1-2 hours 4-5 times a week.    Review of Systems  Gastrointestinal: Negative for diarrhea and constipation.  Genitourinary: Negative for dysuria, hematuria, vaginal discharge and difficulty urinating.  Allergic/Immunologic: Positive for environmental allergies.       Objective:   Physical Exam  Nursing note and vitals reviewed. Constitutional: She is oriented to person, place, and time. She appears well-developed and well-nourished. No distress.  HENT:  Head: Normocephalic and atraumatic.  Nose: Nose normal.  Mouth/Throat: Oropharynx is clear and moist. No oropharyngeal exudate.  Eyes: Pupils  are equal, round, and reactive to light.  Neck: Neck supple.  Normal thyroid  Cardiovascular: Normal rate, regular rhythm and normal heart sounds.  Exam reveals no gallop and no friction rub.   No murmur heard. Pulmonary/Chest: Effort normal and breath sounds normal. No respiratory distress. She has no wheezes. She has no rales.  Musculoskeletal: She exhibits no edema.  Lymphadenopathy:    She has cervical adenopathy (mild, anterior).  Neurological: She is alert and oriented to person, place, and time. No cranial nerve deficit.  Skin: Skin is warm and dry. No rash noted.  Psychiatric: She has a normal mood and affect. Her behavior is normal.    BP 98/62  Pulse 67  Temp(Src) 98 F (36.7 C) (Oral)  Resp 16  Ht 5' 6.75" (1.695 m)  Wt 153 lb (69.4 kg)  BMI 24.16 kg/m2  SpO2 99%  LMP 10/28/2013       Assessment & Plan:   Preventative health care - In 2011, patient did have a positive HSV- 1 antibody, but negative HSV- 2 antibodies. Also, it appears she has not been vaccinated against Hepatitis B, cont Yaz  Anxiety state - Plan: ALPRAZolam (XANAX) 0.5 MG tablet, TSH -  Increase Zoloft to 151m x 4 weeks, patient will then call with update. If she is doing well, will rewrite for increase dose in Zoloft. If symptoms have not improved or there are side effects, will have patient wean off of Zoloft and cross taper up to a new medication. Patient is seeing psychologist SOneida Hale has significant improvement in her panic attacks. She talked about using a different medication with her PCP Dr. JJacqulynn Hale but I am not sure what medication was discussed. Will ask Cheryl Hale's input if patient needs to be switched off of Zoloft. Will refill Zoloft and Xanax.  Eczema, dyshidrotic - Plan: clobetasol ointment (TEMOVATE) 0.05 %  Tobacco abuse - Plan: CBC with Differential  Screening for deficiency anemia - Plan: CBC with Differential  Screening for cardiovascular, respiratory, and genitourinary  diseases - Plan: Lipid panel, Comprehensive metabolic panel, Vitamin D, 25-hydroxy  Encounter for vitamin deficiency screening - Plan: Vitamin D, 25-hydroxy  Meds ordered this encounter  Medications  . ALPRAZolam (XANAX) 0.5 MG tablet    Sig: Take 1 tablet (0.5 mg total) by mouth as needed for anxiety.    Dispense:  60 tablet    Refill:  0  . clobetasol ointment (TEMOVATE) 0.05 %    Sig: Apply 1 application topically 2 (two) times daily.    Dispense:  30 g    Refill:  0  . drospirenone-ethinyl estradiol (VESTURA) 3-0.02 MG tablet    Sig: Take 1 tablet by mouth daily.    Dispense:  3 Package    Refill:  3  . sertraline (ZOLOFT) 50 MG tablet  Sig: Take 2 tablets (100 mg total) by mouth daily. 1 tablet (50 mg) PO daily.    Dispense:  60 tablet    Refill:  1    I personally performed the services described in this documentation, which was scribed in my presence. The recorded information has been reviewed and considered, and addended by me as needed.  Cheryl Cheadle, MD MPH  Results for orders placed in visit on 11/29/13  CBC WITH DIFFERENTIAL      Result Value Ref Range   WBC 6.8  4.0 - 10.5 K/uL   RBC 4.09  3.87 - 5.11 MIL/uL   Hemoglobin 13.0  12.0 - 15.0 g/dL   HCT 37.8  36.0 - 46.0 %   MCV 92.4  78.0 - 100.0 fL   MCH 31.8  26.0 - 34.0 pg   MCHC 34.4  30.0 - 36.0 g/dL   RDW 13.1  11.5 - 15.5 %   Platelets 255  150 - 400 K/uL   Neutrophils Relative % 49  43 - 77 %   Neutro Abs 3.3  1.7 - 7.7 K/uL   Lymphocytes Relative 41  12 - 46 %   Lymphs Abs 2.8  0.7 - 4.0 K/uL   Monocytes Relative 8  3 - 12 %   Monocytes Absolute 0.5  0.1 - 1.0 K/uL   Eosinophils Relative 2  0 - 5 %   Eosinophils Absolute 0.1  0.0 - 0.7 K/uL   Basophils Relative 0  0 - 1 %   Basophils Absolute 0.0  0.0 - 0.1 K/uL   Smear Review Criteria for review not met    LIPID PANEL      Result Value Ref Range   Cholesterol 183  0 - 200 mg/dL   Triglycerides 114  <150 mg/dL   HDL 86  >39 mg/dL   Total  CHOL/HDL Ratio 2.1     VLDL 23  0 - 40 mg/dL   LDL Cholesterol 74  0 - 99 mg/dL  TSH      Result Value Ref Range   TSH 2.347  0.350 - 4.500 uIU/mL  COMPREHENSIVE METABOLIC PANEL      Result Value Ref Range   Sodium 136  135 - 145 mEq/L   Potassium 4.5  3.5 - 5.3 mEq/L   Chloride 104  96 - 112 mEq/L   CO2 26  19 - 32 mEq/L   Glucose, Bld 81  70 - 99 mg/dL   BUN 16  6 - 23 mg/dL   Creat 0.82  0.50 - 1.10 mg/dL   Total Bilirubin 0.4  0.2 - 1.2 mg/dL   Alkaline Phosphatase 11 (*) 39 - 117 U/L   AST 18  0 - 37 U/L   ALT 8  0 - 35 U/L   Total Protein 6.2  6.0 - 8.3 g/dL   Albumin 3.0 (*) 3.5 - 5.2 g/dL   Calcium 8.7  8.4 - 10.5 mg/dL  VITAMIN D 25 HYDROXY      Result Value Ref Range   Vit D, 25-Hydroxy 66  30 - 89 ng/mL

## 2013-11-30 LAB — VITAMIN D 25 HYDROXY (VIT D DEFICIENCY, FRACTURES): Vit D, 25-Hydroxy: 66 ng/mL (ref 30–89)

## 2013-12-03 ENCOUNTER — Encounter: Payer: Self-pay | Admitting: Family Medicine

## 2013-12-19 ENCOUNTER — Encounter: Payer: 59 | Admitting: Physician Assistant

## 2013-12-23 ENCOUNTER — Other Ambulatory Visit: Payer: Self-pay | Admitting: Physician Assistant

## 2014-01-08 ENCOUNTER — Ambulatory Visit (INDEPENDENT_AMBULATORY_CARE_PROVIDER_SITE_OTHER): Payer: 59

## 2014-01-08 ENCOUNTER — Ambulatory Visit (INDEPENDENT_AMBULATORY_CARE_PROVIDER_SITE_OTHER): Payer: 59 | Admitting: Family Medicine

## 2014-01-08 VITALS — BP 104/66 | HR 75 | Temp 98.6°F | Resp 16 | Ht 66.75 in | Wt 158.4 lb

## 2014-01-08 DIAGNOSIS — B009 Herpesviral infection, unspecified: Secondary | ICD-10-CM

## 2014-01-08 DIAGNOSIS — F419 Anxiety disorder, unspecified: Secondary | ICD-10-CM

## 2014-01-08 DIAGNOSIS — R140 Abdominal distension (gaseous): Secondary | ICD-10-CM

## 2014-01-08 LAB — POCT CBC
GRANULOCYTE PERCENT: 53.5 % (ref 37–80)
HCT, POC: 38.1 % (ref 37.7–47.9)
HEMOGLOBIN: 12.6 g/dL (ref 12.2–16.2)
Lymph, poc: 3.5 — AB (ref 0.6–3.4)
MCH: 31.8 pg — AB (ref 27–31.2)
MCHC: 33 g/dL (ref 31.8–35.4)
MCV: 96.5 fL (ref 80–97)
MID (CBC): 0.6 (ref 0–0.9)
MPV: 8.2 fL (ref 0–99.8)
PLATELET COUNT, POC: 278 10*3/uL (ref 142–424)
POC Granulocyte: 4.8 (ref 2–6.9)
POC LYMPH PERCENT: 39.6 %L (ref 10–50)
POC MID %: 6.9 % (ref 0–12)
RBC: 3.95 M/uL — AB (ref 4.04–5.48)
RDW, POC: 13.2 %
WBC: 8.9 10*3/uL (ref 4.6–10.2)

## 2014-01-08 MED ORDER — VENLAFAXINE HCL ER 37.5 MG PO CP24: ORAL_CAPSULE | ORAL | Status: DC

## 2014-01-08 NOTE — Progress Notes (Signed)
Subjective:    Patient ID: Cheryl Hale, female    DOB: 02/11/1981, 33 y.o.   MRN: [id number]   PCP: Falisa Lamora, PA-C  Chief Complaint  Patient presents with  . Medication Problem    Zoloft- would like to discuss other options   . Bloated    x3 weeks   . Diarrhea    Loose stools, and increased gas x3 weeks     Allergies  Allergen Reactions  . Sulfa Antibiotics Other (See Comments)    childhood    Patient Active Problem List   Diagnosis Date Noted  . Anxiety 06/06/2013  . Tobacco abuse 12/18/2012  . 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 Allergic rhinitis 08/22/2012  . Benign neoplasm of skin 08/22/2012  . Herpes 08/22/2012  . Menstrual molimen 08/22/2012  . 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 Authorizing Provider  ALPRAZolam Duanne Moron) 0.5 MG tablet Take 1 tablet (0.5 mg total) by mouth as needed for anxiety. 11/29/13  Yes Shawnee Knapp, MD  drospirenone-ethinyl estradiol (VESTURA) 3-0.02 MG tablet Take 1 tablet by mouth daily. 11/29/13  Yes Shawnee Knapp, MD  mometasone (NASONEX) 50 MCG/ACT nasal spray Place 2 sprays into the nose daily.   Yes Historical Provider, MD  sertraline (ZOLOFT) 100 MG tablet Take 100 mg by mouth. 12/26/13  Yes Historical Provider, MD  clotrimazole-betamethasone (LOTRISONE) cream Apply topically as needed. 07/25/13   Historical Provider, MD    Medical, Surgical, Family and Social History reviewed and updated. We reviewed the outside information (problem list, medications and allergies) together and she was surprised to hear of a diagnosis of herpes.  She has a history of fever blisters, but has never been told that she had genital herpes and was not aware that she has ever been tested. Review of labs performed at other institutions [facility] and [facility]) did not reveal any such testing. We agree to testing today to clarify this issue.  HPI  This 33 y.o. female  presents for evaluation of anxiety, hoping to discuss alternative medications, and 3 weeks of feeling bloated and stool changes.  When we initiated treatment of her anxiety with sertraline, we selected that drug over venlafaxine due to concerns over the anticipated temporary increase in anxiety and irritability when initiating venlafaxine. She did well on sertraline, though noted some modest weight gain. Since then, her symptoms have worsened, associated with a promotion at work and the associated increased stress. In addition, she is in temporary housing, [place] the opposite side of town from her job, and is house-hunting.  At her visit with Dr. Brigitte Pulse last month, the sertraline was increased from 50 mg to 100 mg daily.  She is not improved, and is interested in coming off the sertraline and starting venlafaxine.  Also, she describes GI symptoms over the past week. Whenever she eats, her stomach starts rumbling. Early satiety, feels bloated. Then gets hungry again soon. Tried eliminating spicy foods and other foods that may be causative, but without improvement. Pepto-Bismol, TUMS, Pepcid also without improvement. Loose stools and reduced frequency of stooling. No nausea, heartburn. No melena, hematochezia, mucous in stools. No pain with stooling.  Review of Systems As above.    Objective:   Physical Exam  Constitutional: She is oriented to person, place, and time. She appears well-developed and well-nourished. She is active and cooperative. No distress.  BP 104/66 mmHg  Pulse 75  Temp(Src) 98.6  F (37 C) (Oral)  Resp 16  Ht 5' 6.75" (1.695 m)  Wt 158 lb 6.4 oz (71.85 kg)  BMI 25.01 kg/m2  SpO2 98%  LMP 12/06/2013   Eyes: Conjunctivae are normal. No scleral icterus.  Neck: Neck supple. No thyromegaly present.  Cardiovascular: Normal rate, regular rhythm, normal heart sounds and intact distal pulses.   Pulmonary/Chest: Effort normal and breath sounds normal.  Abdominal: Soft. Bowel sounds  are normal. She exhibits no distension and no mass. There is no hepatosplenomegaly. There is no tenderness. There is no rebound and no guarding.  Lymphadenopathy:    She has no cervical adenopathy.  Neurological: She is alert and oriented to person, place, and time.  Skin: Skin is warm and dry.  Psychiatric: She has a normal mood and affect. Her speech is normal and behavior is normal. Judgment normal. Cognition and memory are normal. She expresses no homicidal and no suicidal ideation.   TSH last month was normal.  Results for orders placed or performed in visit on 01/08/14  POCT CBC  Result Value Ref Range   WBC 8.9 4.6 - 10.2 K/uL   Lymph, poc 3.5 (A) 0.6 - 3.4   POC LYMPH PERCENT 39.6 10 - 50 %L   MID (cbc) 0.6 0 - 0.9   POC MID % 6.9 0 - 12 %M   POC Granulocyte 4.8 2 - 6.9   Granulocyte percent 53.5 37 - 80 %G   RBC 3.95 (A) 4.04 - 5.48 M/uL   Hemoglobin 12.6 12.2 - 16.2 g/dL   HCT, POC 38.1 37.7 - 47.9 %   MCV 96.5 80 - 97 fL   MCH, POC 31.8 (A) 27 - 31.2 pg   MCHC 33.0 31.8 - 35.4 g/dL   RDW, POC 13.2 %   Platelet Count, POC 278 142 - 424 K/uL   MPV 8.2 0 - 99.8 fL    2 View Abdomen: [facility] reading (PRIMARY) by  Dr. Joseph Art. Large stool and gas in colon. No ileus.      Assessment & Plan:  1. Herpes Reported from an outside facility. Lab today to clarify. Known previous fever blisters. - HSV(herpes simplex vrs) 1+2 ab-IgG  2. Abdominal bloating Likely irritable bowel symptomatolgy due to increased stress. Information on Miralax "clean out" provided; encouraged plenty of fluids and dietary fiber. - POCT CBC - DG Abd 2 Views; Future  3. Anxiety Taper off sertraline over the next 3-6 weeks, then start venlafaxine. RTC 4-6 weeks after starting new product. Anticipatory guidance provided. May need a refill of alprazolam. - venlafaxine XR (EFFEXOR-XR) 37.5 MG 24 hr capsule; Start with 1 daily. After 1 week, increase to 2 daily.  Dispense: 60 capsule; Refill:  1   Fara Chute, PA-C Physician Assistant-Certified [facility]

## 2014-01-08 NOTE — Patient Instructions (Addendum)
HOW TO GET YOUR KID UNCLOGGED ASAP (copied from It's No Accident by Dr. Nyra Capes, pp. [phone])  Initial Clean Out: Start this process on a Friday night, so you have the entire weekend to get the job done. For children lighter than 45 pounds, mix seven doses of MiraLAX or generic brand powder (one dose is a cap filled to the line) in 32 ounces of Gatorade, Pedialyte, or other clear, noncarbonated liquid. Gatorade and Pedialyte help prevent dehydration because they contain electrolytes.  Plus, they taste good, so kids may be more motivated to drink the full dose.  Have your child drink the entire bottle over twenty-four hours. She can eat anything she wants, but should drink only this fluid. Kids weighing between forty-five and eighty pounds should get ten doses, and those weighing at least eighty pounds should receive fourteen doses of laxative in 64 ounces of fluid over the first twenty-four hours.  Don't give her less than this amount! You may even need to give her more.  The endpoint you're shooting for is watery poop.  Your child should pass five or six stools within twenty-four to forty-eight hours.  If she doesn't, keep her on the maintenance dose described below and try the initial clean out program again the next weekend, keeping her on a daily capful of MiraLAX during the week.  Maintenance: Following the bowel clearing, give your child one cap of the laxative in 8 ounces of fluid daily, and have her sit on the toilet after all meals, especially breakfast, using a footstool for support if necessary.  She should blow out while pooping, which will help her bottom muscles relax.   For Anxiety: Reduce the sertraline from 100 mg to 50 mg now. Take 50 mg for at least 1 week, 2-4 weeks if needed. Then reduce to 25 mg daily for at least 1 week, 2-4 if needed. Then stop it. In 3-5 days, you may start the venlafaxine extended release 37.5 mg capsules, once daily in the morning for 1 week. Then increase to 2  capsules (75 mg) once daily.  I will contact you with your lab results as soon as they are available.   If you have not heard from me in 2 weeks, please contact me.  The fastest way to get your results is to register for My Chart (see the instructions on the last page of this printout).

## 2014-01-10 LAB — HSV(HERPES SIMPLEX VRS) I + II AB-IGG: HSV 2 GLYCOPROTEIN G AB, IGG: 0.18 IV

## 2014-02-06 ENCOUNTER — Telehealth: Payer: 59 | Admitting: Physician Assistant

## 2014-02-06 DIAGNOSIS — J069 Acute upper respiratory infection, unspecified: Secondary | ICD-10-CM

## 2014-02-06 DIAGNOSIS — B9789 Other viral agents as the cause of diseases classified elsewhere: Principal | ICD-10-CM

## 2014-02-06 MED ORDER — BENZONATATE 100 MG PO CAPS: 100.0000 mg | ORAL_CAPSULE | Freq: Three times a day (TID) | ORAL | Status: DC | PRN

## 2014-02-06 NOTE — Progress Notes (Signed)
We are sorry that you are not feeling well.  Here is how we plan to help!  Based on what you have shared with me it looks like you have upper respiratory tract inflammation that has resulted in a signification cough.  Inflammation and infection in the upper respiratory tract is commonly called bronchitis and has four common causes:  Allergies, Viral Infections, Acid Reflux and Bacterial Infections.  Allergies, viruses and acid reflux are treated by controlling symptoms or eliminating the cause. An example might be a cough caused by taking certain blood pressure medications. You stop the cough by changing the medication. Another example might be a cough caused by acid reflux. Controlling the reflux helps control the cough.  Based on your presentation I believe you most likely have A cough due to a virus.  This is called viral bronchitis and is best treated by rest, plenty of fluids and control of the cough.  You may use Ibuprofen or Tylenol as directed to help your symptoms.    In addition you may use A prescription cough medication called Tessalon Perles 100mg . You may take 1-2 capsules every 8 hours as needed for your cough.    HOME CARE . Only take medications as instructed by your medical team. . Complete the entire course of an antibiotic. . Drink plenty of fluids and get plenty of rest. . Avoid close contacts especially the very young and the elderly . Cover your mouth if you cough or cough into your sleeve. . Always remember to wash your hands . A steam or ultrasonic humidifier can help congestion.    GET HELP RIGHT AWAY IF: . You develop worsening fever. . You become short of breath . You cough up blood. . Your symptoms persist after you have completed your treatment plan MAKE SURE YOU   Understand these instructions.  Will watch your condition.  Will get help right away if you are not doing well or get worse.  Your e-visit answers were reviewed by a board certified advanced  clinical practitioner to complete your personal care plan.  Depending on the condition, your plan could have included both over the counter or prescription medications.  Please review your pharmacy choice.  If there is a problem, you may call our nursing hot line at [phone] and have the prescription routed to another pharmacy.  Your safety is important to [place].  If you have drug allergies check your prescription carefully.    You can use MyChart to ask questions about today's visit, request a non-urgent call back, or ask for a work or school excuse.  You will get an e-mail in the next two days asking about your experience.  I hope that your e-visit has been valuable and will speed your recovery. Thank you for using e-visits.

## 2014-03-14 ENCOUNTER — Encounter: Payer: Self-pay | Admitting: Physician Assistant

## 2014-03-14 DIAGNOSIS — F419 Anxiety disorder, unspecified: Secondary | ICD-10-CM

## 2014-04-18 MED ORDER — VENLAFAXINE HCL ER 75 MG PO CP24
ORAL_CAPSULE | ORAL | Status: DC
Start: 2014-04-18 — End: 2014-05-27

## 2014-04-18 NOTE — Telephone Encounter (Signed)
Meds ordered this encounter  Medications  . venlafaxine XR (EFFEXOR-XR) 75 MG 24 hr capsule    Sig: Start with 1 daily. After 1 week, increase to 2 daily.    Dispense:  90 capsule    Refill:  3    Order Specific Question:  Supervising Provider    Answer:  DOOLITTLE, ROBERT P [[id number]]

## 2014-05-27 ENCOUNTER — Ambulatory Visit (INDEPENDENT_AMBULATORY_CARE_PROVIDER_SITE_OTHER): Payer: 59 | Admitting: Physician Assistant

## 2014-05-27 ENCOUNTER — Encounter: Payer: Self-pay | Admitting: Physician Assistant

## 2014-05-27 VITALS — BP 99/66 | HR 76 | Temp 98.0°F | Resp 16 | Ht 67.0 in | Wt 161.0 lb

## 2014-05-27 DIAGNOSIS — F17200 Nicotine dependence, unspecified, uncomplicated: Secondary | ICD-10-CM

## 2014-05-27 DIAGNOSIS — Z720 Tobacco use: Secondary | ICD-10-CM

## 2014-05-27 DIAGNOSIS — Z6825 Body mass index (BMI) 25.0-25.9, adult: Secondary | ICD-10-CM | POA: Insufficient documentation

## 2014-05-27 DIAGNOSIS — L989 Disorder of the skin and subcutaneous tissue, unspecified: Secondary | ICD-10-CM | POA: Diagnosis not present

## 2014-05-27 DIAGNOSIS — F419 Anxiety disorder, unspecified: Secondary | ICD-10-CM

## 2014-05-27 MED ORDER — ALPRAZOLAM 0.5 MG PO TABS: 0.5000 mg | ORAL_TABLET | ORAL | Status: DC | PRN

## 2014-05-27 MED ORDER — VENLAFAXINE HCL ER 75 MG PO CP24: ORAL_CAPSULE | ORAL | Status: DC

## 2014-05-27 NOTE — Progress Notes (Signed)
Patient ID: Cheryl Hale, female    DOB: 1980/07/12, 34 y.o.   MRN: [id number]  PCP: Wynne Dust  Subjective:   Chief Complaint  Patient presents with  . Medication Management  . Medication Refill    HPI Presents for medication refill and follow-up on change to venlafaxine. Venlafaxine is working. Still has some anxiety, but she's still stressed at work, which she thinks is the trigger most of the time. Uses alprazolam PRN. Continues to see [facility] hunt for counseling.  Continues to have problems with the dry, cracked, peeling skin on the palm of the LEFT hand. Uses a salicylic acid cream for psoriasis, which works better than steroid cream. Present more than 1 year. Ready for dermatology evaluation.  Review of Systems Review of Systems As above.     Patient Active Problem List   Diagnosis Date Noted  . BMI 25.0-25.9,adult 05/27/2014  . Anxiety 06/06/2013  . Tobacco abuse 12/18/2012  . 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 Allergic rhinitis 08/22/2012  . Benign neoplasm of skin 08/22/2012  . Menstrual molimen 08/22/2012  . 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 Authorizing Provider  ALPRAZolam Duanne Moron) 0.5 MG tablet Take 1 tablet (0.5 mg total) by mouth as needed for anxiety. 11/29/13  Yes Shawnee Knapp, MD  drospirenone-ethinyl estradiol (VESTURA) 3-0.02 MG tablet Take 1 tablet by mouth daily. 11/29/13  Yes Shawnee Knapp, MD  venlafaxine XR (EFFEXOR-XR) 75 MG 24 hr capsule Start with 1 daily. After 1 week, increase to 2 daily. 04/18/14  Yes Buena Boehm S Jhoanna Heyde, PA-C  clobetasol ointment (TEMOVATE) 6.96 % Apply 1 application topically 2 (two) times daily as needed.    Historical Provider, MD  mometasone (NASONEX) 50 MCG/ACT nasal spray Place 2 sprays into the nose daily.    Historical Provider, MD     Allergies  Allergen Reactions  . Sulfa Antibiotics Other (See Comments)    childhood         Objective:  Physical Exam  Physical Exam  Constitutional: She is oriented to person, place, and time. She appears well-developed and well-nourished. She is active and cooperative. No distress.  BP 99/66 mmHg  Pulse 76  Temp(Src) 98 F (36.7 C)  Resp 16  Ht 5\' 7"  (1.702 m)  Wt 161 lb (73.029 kg)  BMI 25.21 kg/m2  SpO2 98%   Eyes: Conjunctivae are normal.  Pulmonary/Chest: Effort normal.  Neurological: She is alert and oriented to person, place, and time.  Skin: Lesion (dry, scaled; erythematous base. no induration. no drainage) noted.     Psychiatric: She has a normal mood and affect. Her speech is normal and behavior is normal.           Assessment & Plan:  1. Skin lesion OK to continue current cream. - Ambulatory referral to Dermatology  2. Anxiety Controlled. Continue current treatment. - venlafaxine XR (EFFEXOR-XR) 75 MG 24 hr capsule; Start with 1 daily. After 1 week, increase to 2 daily.  Dispense: 90 capsule; Refill: 3 - ALPRAZolam (XANAX) 0.5 MG tablet; Take 1 tablet (0.5 mg total) by mouth as needed for anxiety.  Dispense: 60 tablet; Refill: 0  3. Smoker Encouraged cessation. Not ready to quit, but has cut back considerably.  4. BMI 25.0-25.9,adult Healthy eating and regular exercise.  Return in about 6 months (around 11/27/2014) for Annual Exam and follow-up.   Fara Chute, PA-C Physician Assistant-Certified [facility]

## 2014-07-14 ENCOUNTER — Encounter: Payer: Self-pay | Admitting: Physician Assistant

## 2014-08-22 ENCOUNTER — Encounter: Payer: Self-pay | Admitting: Physician Assistant

## 2014-09-08 ENCOUNTER — Encounter: Payer: Self-pay | Admitting: Physician Assistant

## 2014-10-07 ENCOUNTER — Other Ambulatory Visit: Payer: Self-pay | Admitting: Family Medicine

## 2014-10-13 ENCOUNTER — Encounter: Payer: Self-pay | Admitting: Physician Assistant

## 2014-10-13 DIAGNOSIS — F419 Anxiety disorder, unspecified: Secondary | ICD-10-CM

## 2014-10-14 ENCOUNTER — Other Ambulatory Visit: Payer: Self-pay

## 2014-10-14 MED ORDER — ALPRAZOLAM 0.5 MG PO TABS: 0.5000 mg | ORAL_TABLET | ORAL | Status: DC | PRN

## 2014-10-14 NOTE — Telephone Encounter (Signed)
Faxed Rx to her pharmacy. Alprazolam

## 2014-10-14 NOTE — Telephone Encounter (Signed)
Patient notified via My Chart.  Printed at 104. Please fax/call to her pharmacy.  Meds ordered this encounter  Medications  . ALPRAZolam (XANAX) 0.5 MG tablet    Sig: Take 1 tablet (0.5 mg total) by mouth as needed for anxiety.    Dispense:  60 tablet    Refill:  0    Order Specific Question:  Supervising Provider    Answer:  DOOLITTLE, ROBERT P [[id number]

## 2014-12-20 ENCOUNTER — Other Ambulatory Visit: Payer: Self-pay | Admitting: Family Medicine

## 2014-12-23 ENCOUNTER — Encounter: Payer: 59 | Admitting: Physician Assistant

## 2015-01-02 IMAGING — CR DG ABDOMEN 2V
2 series · 2 of 2 positions shown · non-contrast
Comparison: None.

CLINICAL DATA: One month history of intermittent abdominal pain

EXAM:
ABDOMEN - 2 VIEW

[AP (1 of 2)]
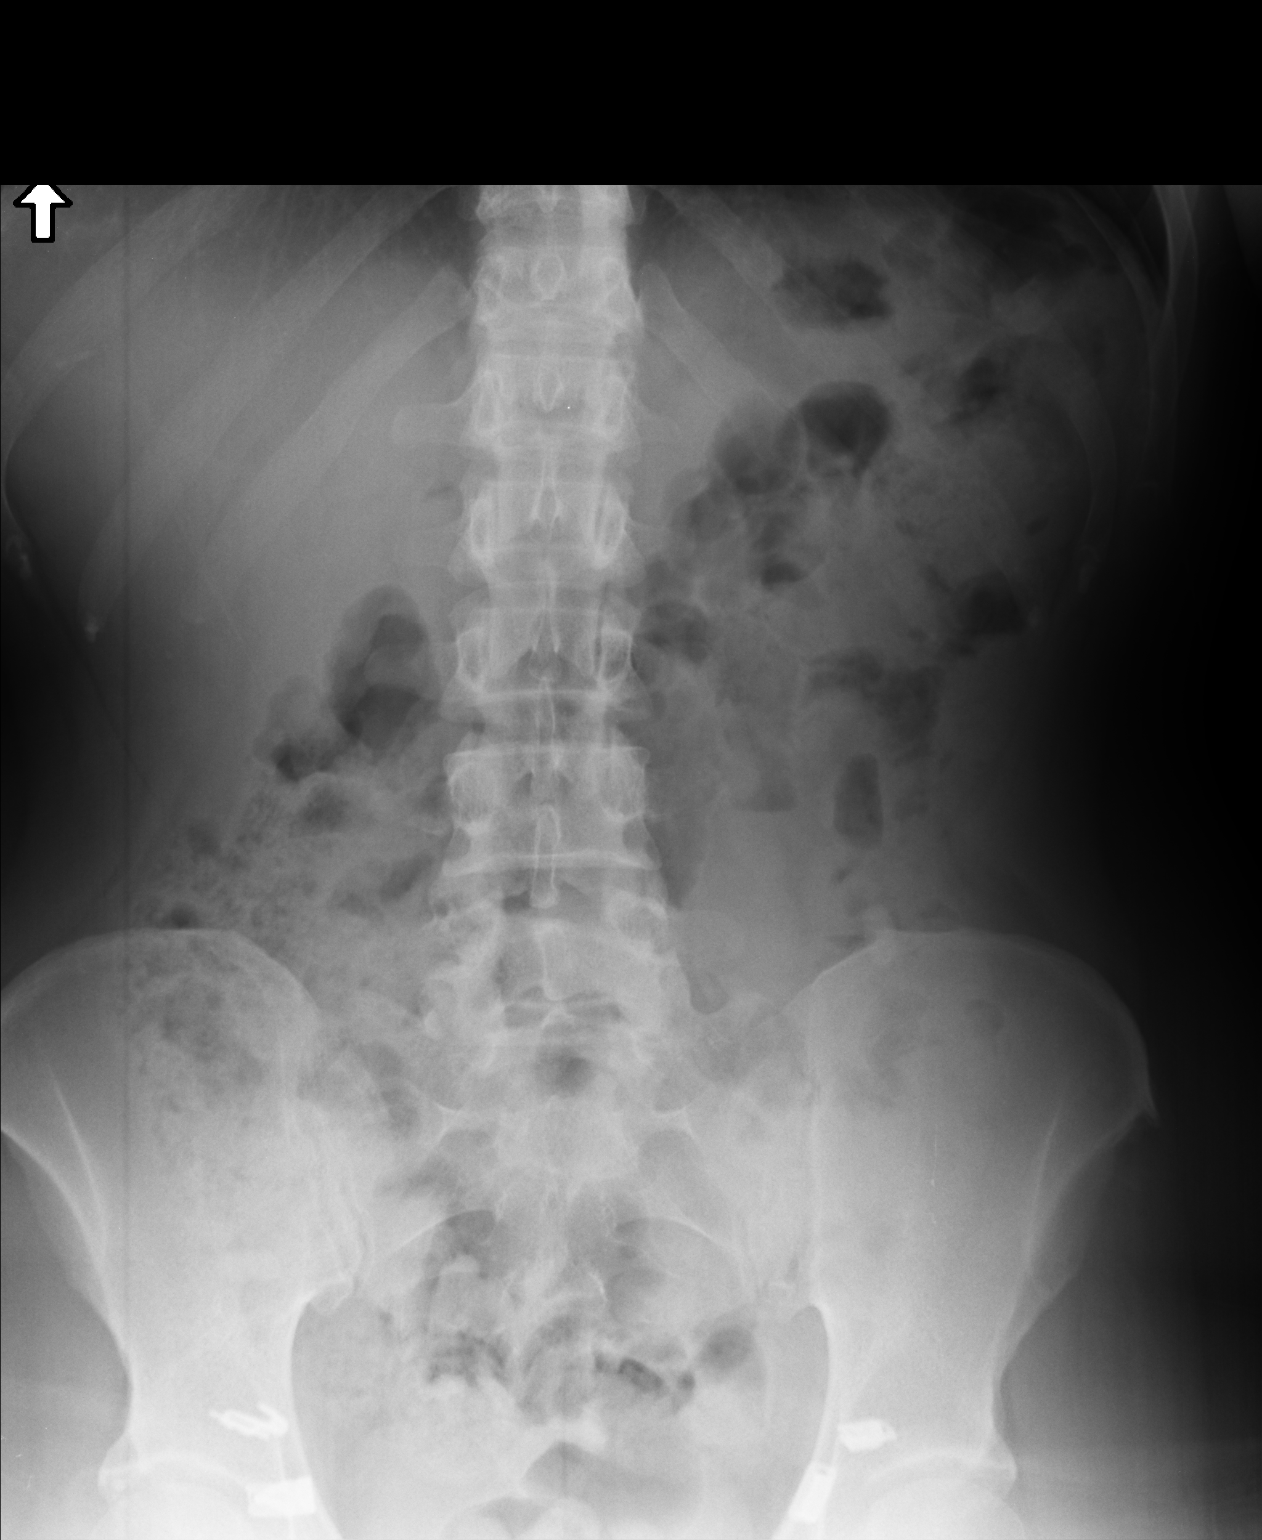

[AP (2 of 2)]
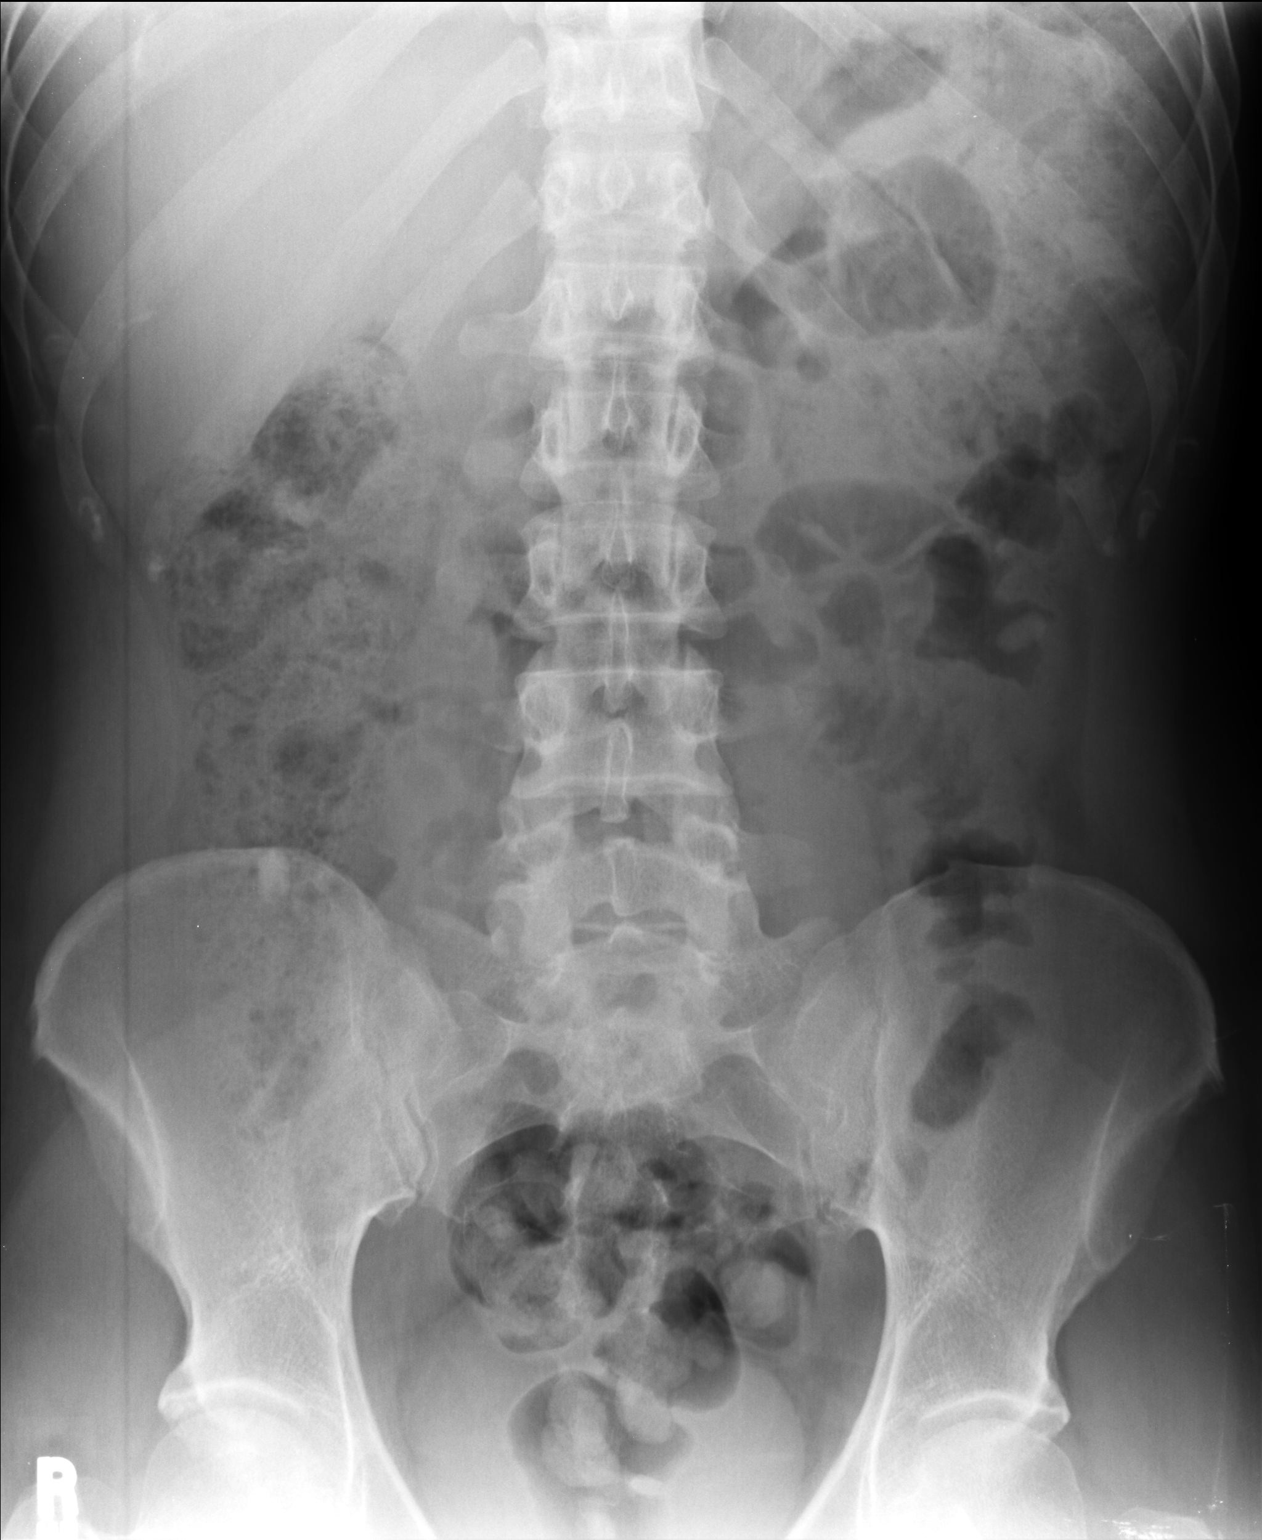

[2 of 2 positions shown; findings below may reference images not displayed]

FINDINGS: Supine and upright images were obtained. There is moderate diffuse
stool throughout the colon. Bowel gas pattern is unremarkable. No
obstruction or free air. No abnormal calcifications.
IMPRESSION: No bowel dilatation or air-fluid level to suggest obstruction.
Overall bowel gas pattern unremarkable. Moderate diffuse stool
throughout colon.

## 2015-02-10 ENCOUNTER — Encounter: Payer: Self-pay | Admitting: Physician Assistant

## 2015-02-10 ENCOUNTER — Ambulatory Visit (INDEPENDENT_AMBULATORY_CARE_PROVIDER_SITE_OTHER): Payer: 59 | Admitting: Physician Assistant

## 2015-02-10 VITALS — BP 100/76 | HR 75 | Temp 98.6°F | Resp 16 | Ht 66.75 in | Wt 172.6 lb

## 2015-02-10 DIAGNOSIS — Z124 Encounter for screening for malignant neoplasm of cervix: Secondary | ICD-10-CM

## 2015-02-10 DIAGNOSIS — Z130 Encounter for screening for diseases of the blood and blood-forming organs and certain disorders involving the immune mechanism: Secondary | ICD-10-CM | POA: Diagnosis not present

## 2015-02-10 DIAGNOSIS — Z309 Encounter for contraceptive management, unspecified: Secondary | ICD-10-CM

## 2015-02-10 DIAGNOSIS — Z1231 Encounter for screening mammogram for malignant neoplasm of breast: Secondary | ICD-10-CM | POA: Diagnosis not present

## 2015-02-10 DIAGNOSIS — Z0000 Encounter for general adult medical examination without abnormal findings: Secondary | ICD-10-CM | POA: Diagnosis not present

## 2015-02-10 DIAGNOSIS — Z13228 Encounter for screening for other metabolic disorders: Secondary | ICD-10-CM

## 2015-02-10 DIAGNOSIS — Z789 Other specified health status: Secondary | ICD-10-CM

## 2015-02-10 LAB — CBC WITH DIFFERENTIAL/PLATELET
BASOS PCT: 0 % (ref 0–1)
Basophils Absolute: 0 10*3/uL (ref 0.0–0.1)
Eosinophils Absolute: 0.1 10*3/uL (ref 0.0–0.7)
Eosinophils Relative: 1 % (ref 0–5)
HEMATOCRIT: 39 % (ref 36.0–46.0)
HEMOGLOBIN: 13.2 g/dL (ref 12.0–15.0)
LYMPHS ABS: 2.8 10*3/uL (ref 0.7–4.0)
LYMPHS PCT: 31 % (ref 12–46)
MCH: 31.9 pg (ref 26.0–34.0)
MCHC: 33.8 g/dL (ref 30.0–36.0)
MCV: 94.2 fL (ref 78.0–100.0)
MONO ABS: 0.5 10*3/uL (ref 0.1–1.0)
MONOS PCT: 6 % (ref 3–12)
MPV: 10.1 fL (ref 8.6–12.4)
NEUTROS ABS: 5.6 10*3/uL (ref 1.7–7.7)
NEUTROS PCT: 62 % (ref 43–77)
Platelets: 319 10*3/uL (ref 150–400)
RBC: 4.14 MIL/uL (ref 3.87–5.11)
RDW: 12.4 % (ref 11.5–15.5)
WBC: 9.1 10*3/uL (ref 4.0–10.5)

## 2015-02-10 LAB — COMPREHENSIVE METABOLIC PANEL
ALBUMIN: 3.3 g/dL — AB (ref 3.6–5.1)
ALT: 15 U/L (ref 6–29)
AST: 29 U/L (ref 10–30)
Alkaline Phosphatase: 12 U/L — ABNORMAL LOW (ref 33–115)
BILIRUBIN TOTAL: 0.5 mg/dL (ref 0.2–1.2)
BUN: 15 mg/dL (ref 7–25)
CALCIUM: 8.9 mg/dL (ref 8.6–10.2)
CHLORIDE: 98 mmol/L (ref 98–110)
CO2: 24 mmol/L (ref 20–31)
CREATININE: 0.92 mg/dL (ref 0.50–1.10)
Glucose, Bld: 76 mg/dL (ref 65–99)
Potassium: 4 mmol/L (ref 3.5–5.3)
SODIUM: 135 mmol/L (ref 135–146)
TOTAL PROTEIN: 7 g/dL (ref 6.1–8.1)

## 2015-02-10 MED ORDER — DROSPIRENONE-ETHINYL ESTRADIOL 3-0.02 MG PO TABS: ORAL_TABLET | ORAL | Status: DC

## 2015-02-10 NOTE — Patient Instructions (Signed)
I will contact you with your lab results as soon as they are available.   If you have not heard from me in 2 weeks, please contact me.  The fastest way to get your results is to register for My Chart (see the instructions on the last page of this printout).  Keeping You Healthy  Get These Tests 1. Blood Pressure- Have your blood pressure checked once a year by your health care provider.  Normal blood pressure is 120/80. 2. Weight- Have your body mass index (BMI) calculated to screen for obesity.  BMI is measure of body fat based on height and weight.  You can also calculate your own BMI at www.nhlbisupport.com/bmi/. 3. Cholesterol- Have your cholesterol checked every 5 years starting at age 20 then yearly starting at age 45. 4. Chlamydia, HIV, and other sexually transmitted diseases- Get screened every year until age 25, then within three months of each new sexual provider. 5. Pap Test - Every 1-5 years; discuss with your health care provider. 6. Mammogram- Every 1-2 years starting at age 40--50  Take these medicines  Calcium with Vitamin D-Your body needs 1200 mg of Calcium each day and 800-1000 IU of Vitamin D daily.  Your body can only absorb 500 mg of Calcium at a time so Calcium must be taken in 2 or 3 divided doses throughout the day.  Multivitamin with folic acid- Once daily if it is possible for you to become pregnant.  Get these Immunizations  Gardasil-Series of three doses; prevents HPV related illness such as genital warts and cervical cancer.  Menactra-Single dose; prevents meningitis.  Tetanus shot- Every 10 years.  Flu shot-Every year.  Take these steps 1. Do not smoke-Your healthcare provider can help you quit.  For tips on how to quit go to www.smokefree.gov or call 1-800 QUITNOW. 2. Be physically active- Exercise 5 days a week for at least 30 minutes.  If you are not already physically active, start slow and gradually work up to 30 minutes of moderate physical  activity.  Examples of moderate activity include walking briskly, dancing, swimming, bicycling, etc. 3. Breast Cancer- A self breast exam every month is important for early detection of breast cancer.  For more information and instruction on self breast exams, ask your healthcare provider or www.womenshealth.gov/faq/breast-self-exam.cfm. 4. Eat a healthy diet- Eat a variety of healthy foods such as fruits, vegetables, whole grains, low fat milk, low fat cheeses, yogurt, lean meats, poultry and fish, beans, nuts, tofu, etc.  For more information go to www. Thenutritionsource.org 5. Drink alcohol in moderation- Limit alcohol intake to one drink or less per day. Never drink and drive. 6. Depression- Your emotional health is as important as your physical health.  If you're feeling down or losing interest in things you normally enjoy please talk to your healthcare provider about being screened for depression. 7. Dental visit- Brush and floss your teeth twice daily; visit your dentist twice a year. 8. Eye doctor- Get an eye exam at least every 2 years. 9. Helmet use- Always wear a helmet when riding a bicycle, motorcycle, rollerblading or skateboarding. 10. Safe sex- If you may be exposed to sexually transmitted infections, use a condom. 11. Seat belts- Seat belts can save your live; always wear one. 12. Smoke/Carbon Monoxide detectors- These detectors need to be installed on the appropriate level of your home. Replace batteries at least once a year. 13. Skin cancer- When out in the sun please cover up and use sunscreen 15 SPF   or higher. 14. Violence- If anyone is threatening or hurting you, please tell your healthcare provider.   Did you know that you begin to benefit from quitting smoking within the first twenty minutes? It's TRUE.  At 20 minutes: -blood pressure decreases -pulse rate drops -body temperature of hands and feet increases  At 8 hours: -carbon monoxide level in blood drops to  normal -oxygen level in blood increases to normal  At 24 hours: -the chance of heart attack decreases  At 48 hours: -nerve endings start regrowing -ability to smell and taste is enhanced  2 weeks-3 months: -circulation improves -walking becomes easier -lung function improves  1-9 months: -coughing, sinus congestion, fatigue and shortness of breath decreases  1 year: -excess risk of heart disease is decreased to HALF that of a smoker  5 years: Stroke risk is reduced to that of people who have never smoked  10 years: -risk of lung cancer drops to as little as half that of continuing smokers -risk of cancer of the mouth, throat, esophagus, bladder, kidney and pancreas decreases -risk of ulcer decreases  15 years -risk of heart disease is now similar to that of people who have never smoked -risk of death returns to nearly the level of people who have never smoked

## 2015-02-10 NOTE — Progress Notes (Signed)
Subjective:    Patient ID: Cheryl Hale, female    DOB: 1980-07-02, 34 y.o.   MRN: [id number]   PCP: Kanani Mowbray, PA-C  Chief Complaint  Patient presents with  . Annual Exam    HPI  Presents for annual Wellness visit and pap testing.  Stressful year at work. Hasn't been exercising, so has gained some weight. Getting back into exercise now. Continues to smoke. About 3 cigarettes/day. Isn't fully ready to quit yet.  Cervical Cancer Screening: 2013, cytology only. Breast Cancer Screening: inconsistent monthly self breast exams. Colorectal Cancer Screening: not indicated. Bone Density Testing: not indicated. HIV Screening: complete STI Screening: no risk-not currently sexually active Seasonal Influenza Vaccination: complete Td/Tdap Vaccination: current, 2011 Pneumococcal Vaccination: not indicated. Zoster Vaccination: not indicated. Frequency of Dental evaluation: Q6 months Frequency of Eye evaluation: every 1-2 years   Review of Systems  Constitutional: Negative.   HENT: Negative.   Eyes: Negative.   Respiratory: Negative.   Cardiovascular: Negative.   Gastrointestinal: Negative.   Endocrine: Negative.   Genitourinary: Negative.   Musculoskeletal: Negative.   Skin: Negative.   Allergic/Immunologic: Negative.   Neurological: Negative.   Hematological: Negative.   Psychiatric/Behavioral: Negative.    Allergies  Allergen Reactions  . Sulfa Antibiotics Other (See Comments)    childhood    Prior to Admission medications   Medication Sig Start Date End Date Taking? Authorizing Provider  ALPRAZolam Duanne Moron) 0.5 MG tablet Take 1 tablet (0.5 mg total) by mouth as needed for anxiety. 10/14/14  Yes Darrik Richman, PA-C  drospirenone-ethinyl estradiol (VESTURA) 3-0.02 MG tablet TAKE 1 TABLET BY MOUTH DAILY 12/21/14  Yes Shawnee Knapp, MD  venlafaxine XR (EFFEXOR-XR) 75 MG 24 hr capsule Start with 1 daily. After 1 week, increase to 2 daily. 05/27/14  Yes Elise Knobloch,  PA-C  clobetasol ointment (TEMOVATE) [id number] % Apply 1 application topically 2 (two) times daily as needed.    Historical Provider, MD  mometasone (NASONEX) 50 MCG/ACT nasal spray Place 2 sprays into the nose daily.    Historical Provider, MD    Patient Active Problem List   Diagnosis Date Noted  . BMI 25.0-25.9,adult 05/27/2014  . Anxiety 06/06/2013  . Smoker 12/18/2012  . 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 Allergic rhinitis 08/22/2012  . Benign neoplasm of skin 08/22/2012  . Menstrual molimen 08/22/2012  . 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 Allergy   . Anxiety   . Blood transfusion without reported diagnosis   . Tobacco abuse   . Pyloric stenosis, congenital   . Clotting disorder ([place])     ITP    Past Surgical History  Procedure Laterality Date  . Abdominal surgery  1980    pyloric stenosis   Family History  Problem Relation Age of Onset  . Hypertension Mother   . Hyperlipidemia Mother   . Diabetes Father   . Cancer Maternal Grandfather   . Hypertension Maternal Grandfather   . Diabetes Maternal Grandfather   . Cancer Maternal Grandmother   . Cancer Paternal Grandfather     lymphoma  . Heart disease Paternal Grandfather   . Hyperlipidemia Brother    Social History   Social History  . Marital Status: Single    Spouse Name: n/a  . Number of Children: 0  . Years of Education: college   Occupational History  . HR Benefit Administrator [employer]   Social History Main Topics  . Smoking status: Current Every Day  Smoker -- 0.30 packs/day    Types: Cigarettes  . Smokeless tobacco: Never Used  . Alcohol Use: 1.8 oz/week    3 Standard drinks or equivalent per week  . Drug Use: No  . Sexual Activity: Not Currently   Other Topics Concern  . Not on file   Social History Narrative   Education: [employer]. Lives alone. Exercise: kickbox 1-2 hours 4-5 times a week.       Objective:   Physical Exam  Constitutional:  She is oriented to person, place, and time. Vital signs are normal. She appears well-developed and well-nourished. She is active and cooperative. No distress.  BP 100/76 mmHg  Pulse 75  Temp(Src) 98.6 F (37 C) (Oral)  Resp 16  Ht 5' 6.75" (1.695 m)  Wt 172 lb 9.6 oz (78.291 kg)  BMI 27.25 kg/m2  SpO2 99%  LMP 01/12/2015   HENT:  Head: Normocephalic and atraumatic.  Right Ear: Hearing, tympanic membrane, external ear and ear canal normal. No foreign bodies.  Left Ear: Hearing, tympanic membrane, external ear and ear canal normal. No foreign bodies.  Nose: Nose normal.  Mouth/Throat: Uvula is midline, oropharynx is clear and moist and mucous membranes are normal. No oral lesions. Normal dentition. No dental abscesses or uvula swelling. No oropharyngeal exudate.  Eyes: Conjunctivae, EOM and lids are normal. Pupils are equal, round, and reactive to light. Right eye exhibits no discharge. Left eye exhibits no discharge. No scleral icterus.  Fundoscopic exam:      The right eye shows no arteriolar narrowing, no AV nicking, no exudate, no hemorrhage and no papilledema. The right eye shows red reflex.       The left eye shows no arteriolar narrowing, no AV nicking, no exudate, no hemorrhage and no papilledema. The left eye shows red reflex.  Neck: Trachea normal, normal range of motion and full passive range of motion without pain. Neck supple. No spinous process tenderness and no muscular tenderness present. No thyroid mass and no thyromegaly present.  Cardiovascular: Normal rate, regular rhythm, normal heart sounds, intact distal pulses and normal pulses.   Pulmonary/Chest: Effort normal and breath sounds normal. She exhibits no tenderness and no retraction. Right breast exhibits no inverted nipple, no mass, no nipple discharge, no skin change and no tenderness. Left breast exhibits no inverted nipple, no mass, no nipple discharge, no skin change and no tenderness. Breasts are symmetrical.    Abdominal: Soft. Normal appearance and bowel sounds are normal. She exhibits no distension and no mass. There is no hepatosplenomegaly. There is no tenderness. There is no rigidity, no rebound, no guarding, no CVA tenderness, no tenderness at McBurney's point and negative Murphy's sign. No hernia. Hernia confirmed negative in the right inguinal area and confirmed negative in the left inguinal area.  Genitourinary: Rectum normal, vagina normal and uterus normal. Rectal exam shows no external hemorrhoid and no fissure. No breast swelling, tenderness, discharge or bleeding. Pelvic exam was performed with patient supine. No labial fusion. There is no rash, tenderness, lesion or injury on the right labia. There is no rash, tenderness, lesion or injury on the left labia. Cervix exhibits no motion tenderness, no discharge and no friability. Right adnexum displays no mass, no tenderness and no fullness. Left adnexum displays no mass, no tenderness and no fullness. No erythema, tenderness or bleeding in the vagina. No foreign body around the vagina. No signs of injury around the vagina. No vaginal discharge found.  Musculoskeletal: She exhibits no edema or tenderness.  Cervical back: Normal.       Thoracic back: Normal.       Lumbar back: Normal.  Lymphadenopathy:       Head (right side): No tonsillar, no preauricular, no posterior auricular and no occipital adenopathy present.       Head (left side): No tonsillar, no preauricular, no posterior auricular and no occipital adenopathy present.    She has no cervical adenopathy.    She has no axillary adenopathy.       Right: No inguinal and no supraclavicular adenopathy present.       Left: No inguinal and no supraclavicular adenopathy present.  Neurological: She is alert and oriented to person, place, and time. She has normal strength and normal reflexes. No cranial nerve deficit. She exhibits normal muscle tone. Coordination and gait normal.  Skin: Skin  is warm, dry and intact. No rash noted. She is not diaphoretic. No cyanosis or erythema. Nails show no clubbing.  Psychiatric: She has a normal mood and affect. Her speech is normal and behavior is normal. Judgment and thought content normal.          Assessment & Plan:  1. Annual physical exam Age appropriate anticipatory guidance provided.  2. Screening for cervical cancer If cytology normal and HPV-, repeat both in 5 years. - Pap IG and HPV (high risk) DNA detection  3. Screening for deficiency anemia - CBC with Differential/Platelet  4. Screening for metabolic disorder - Comprehensive metabolic panel  5. Uses birth control Continue current regimen, though she is not sexually active. - drospirenone-ethinyl estradiol (VESTURA) 3-0.02 MG tablet; TAKE 1 TABLET BY MOUTH DAILY  Dispense: 84 tablet; Refill: 4   Return in about 6 months (around 08/11/2015).    Fara Chute, PA-C Physician Assistant-Certified [facility]

## 2015-02-12 LAB — PAP IG AND HPV HIGH-RISK: HPV DNA High Risk: NOT DETECTED

## 2015-05-31 ENCOUNTER — Encounter: Payer: Self-pay | Admitting: Physician Assistant

## 2015-05-31 DIAGNOSIS — F419 Anxiety disorder, unspecified: Secondary | ICD-10-CM

## 2015-06-01 MED ORDER — VENLAFAXINE HCL ER 75 MG PO CP24: ORAL_CAPSULE | ORAL | Status: DC

## 2015-06-01 MED ORDER — ALPRAZOLAM 0.5 MG PO TABS: 0.5000 mg | ORAL_TABLET | ORAL | Status: DC | PRN

## 2015-06-01 NOTE — Telephone Encounter (Signed)
Patient notified via My Chart.  Meds ordered this encounter  Medications  . venlafaxine XR (EFFEXOR-XR) 75 MG 24 hr capsule    Sig: Start with 1 daily. After 1 week, increase to 2 daily.    Dispense:  90 capsule    Refill:  3    Order Specific Question:  Supervising Provider    Answer:  DOOLITTLE, ROBERT P [id number]  . ALPRAZolam (XANAX) 0.5 MG tablet    Sig: Take 1 tablet (0.5 mg total) by mouth as needed for anxiety.    Dispense:  60 tablet    Refill:  0    Order Specific Question:  Supervising Provider    Answer:  DOOLITTLE, ROBERT P [id number]

## 2015-06-02 NOTE — Telephone Encounter (Signed)
Faxed. Pt aware on MyChart.

## 2015-08-04 ENCOUNTER — Ambulatory Visit (INDEPENDENT_AMBULATORY_CARE_PROVIDER_SITE_OTHER): Payer: 59 | Admitting: Physician Assistant

## 2015-08-04 ENCOUNTER — Encounter: Payer: Self-pay | Admitting: Physician Assistant

## 2015-08-04 VITALS — BP 96/64 | HR 63 | Temp 98.7°F | Resp 16 | Ht 67.0 in | Wt 161.2 lb

## 2015-08-04 DIAGNOSIS — Z0000 Encounter for general adult medical examination without abnormal findings: Secondary | ICD-10-CM

## 2015-08-04 DIAGNOSIS — Z833 Family history of diabetes mellitus: Secondary | ICD-10-CM | POA: Diagnosis not present

## 2015-08-04 DIAGNOSIS — F419 Anxiety disorder, unspecified: Secondary | ICD-10-CM | POA: Diagnosis not present

## 2015-08-04 DIAGNOSIS — Z13220 Encounter for screening for lipoid disorders: Secondary | ICD-10-CM

## 2015-08-04 DIAGNOSIS — D239 Other benign neoplasm of skin, unspecified: Secondary | ICD-10-CM | POA: Diagnosis not present

## 2015-08-04 DIAGNOSIS — Z309 Encounter for contraceptive management, unspecified: Secondary | ICD-10-CM | POA: Diagnosis not present

## 2015-08-04 LAB — COMPREHENSIVE METABOLIC PANEL
ALT: 10 U/L (ref 6–29)
AST: 18 U/L (ref 10–30)
Albumin: 3.3 g/dL — ABNORMAL LOW (ref 3.6–5.1)
Alkaline Phosphatase: 13 U/L — ABNORMAL LOW (ref 33–115)
BILIRUBIN TOTAL: 0.4 mg/dL (ref 0.2–1.2)
BUN: 15 mg/dL (ref 7–25)
CALCIUM: 8.5 mg/dL — AB (ref 8.6–10.2)
CHLORIDE: 103 mmol/L (ref 98–110)
CO2: 22 mmol/L (ref 20–31)
CREATININE: 0.85 mg/dL (ref 0.50–1.10)
GLUCOSE: 83 mg/dL (ref 65–99)
Potassium: 4.3 mmol/L (ref 3.5–5.3)
SODIUM: 136 mmol/L (ref 135–146)
Total Protein: 6.6 g/dL (ref 6.1–8.1)

## 2015-08-04 LAB — LIPID PANEL
Cholesterol: 196 mg/dL (ref 125–200)
HDL: 92 mg/dL (ref >=46)
LDL CALC: 82 mg/dL (ref <130)
Total CHOL/HDL Ratio: 2.1 Ratio (ref <=5.0)
Triglycerides: 108 mg/dL (ref <150)
VLDL: 22 mg/dL (ref <30)

## 2015-08-04 MED ORDER — CLOBETASOL PROPIONATE 0.05 % EX OINT: 1.0000 "application " | TOPICAL_OINTMENT | Freq: Two times a day (BID) | CUTANEOUS | Status: DC | PRN

## 2015-08-04 NOTE — Patient Instructions (Addendum)
   IF you received an x-ray today, you will receive an invoice from [facility]. Please contact [facility] at [phone] with questions or concerns regarding your invoice.   IF you received labwork today, you will receive an invoice from [facility]. Please contact [facility] at [phone] with questions or concerns regarding your invoice.   Our billing staff will not be able to assist you with questions regarding bills from these companies.  You will be contacted with the lab results as soon as they are available. The fastest way to get your results is to activate your My Chart account. Instructions are located on the last page of this paperwork. If you have not heard from us regarding the results in 2 weeks, please contact this office.     Keeping You Healthy  Get These Tests 1. Blood Pressure- Have your blood pressure checked once a year by your health care provider.  Normal blood pressure is 120/80. 2. Weight- Have your body mass index (BMI) calculated to screen for obesity.  BMI is measure of body fat based on height and weight.  You can also calculate your own BMI at www.nhlbisupport.com/bmi/. 3. Cholesterol- Have your cholesterol checked every 5 years starting at age 20 then yearly starting at age 45. 4. Chlamydia, HIV, and other sexually transmitted diseases- Get screened every year until age 25, then within three months of each new sexual provider. 5. Pap Test - Every 1-5 years; discuss with your health care provider. 6. Mammogram- Every 1-2 years starting at age 40--50  Take these medicines  Calcium with Vitamin D-Your body needs 1200 mg of Calcium each day and 800-1000 IU of Vitamin D daily.  Your body can only absorb 500 mg of Calcium at a time so Calcium must be taken in 2 or 3 divided doses throughout the day.  Multivitamin with folic acid- Once daily if it is possible for you to become pregnant.  Get these  Immunizations  Gardasil-Series of three doses; prevents HPV related illness such as genital warts and cervical cancer.  Menactra-Single dose; prevents meningitis.  Tetanus shot- Every 10 years.  Flu shot-Every year.  Take these steps 1. Do not smoke-Your healthcare provider can help you quit.  For tips on how to quit go to www.smokefree.gov or call 1-800 QUITNOW. 2. Be physically active- Exercise 5 days a week for at least 30 minutes.  If you are not already physically active, start slow and gradually work up to 30 minutes of moderate physical activity.  Examples of moderate activity include walking briskly, dancing, swimming, bicycling, etc. 3. Breast Cancer- A self breast exam every month is important for early detection of breast cancer.  For more information and instruction on self breast exams, ask your healthcare provider or www.womenshealth.gov/faq/breast-self-exam.cfm. 4. Eat a healthy diet- Eat a variety of healthy foods such as fruits, vegetables, whole grains, low fat milk, low fat cheeses, yogurt, lean meats, poultry and fish, beans, nuts, tofu, etc.  For more information go to www. Thenutritionsource.org 5. Drink alcohol in moderation- Limit alcohol intake to one drink or less per day. Never drink and drive. 6. Depression- Your emotional health is as important as your physical health.  If you're feeling down or losing interest in things you normally enjoy please talk to your healthcare provider about being screened for depression. 7. Dental visit- Brush and floss your teeth twice daily; visit your dentist twice a year. 8. Eye doctor- Get an eye exam at least every   2 years. 9. Helmet use- Always wear a helmet when riding a bicycle, motorcycle, rollerblading or skateboarding. 10. Safe sex- If you may be exposed to sexually transmitted infections, use a condom. 11. Seat belts- Seat belts can save your live; always wear one. 12. Smoke/Carbon Monoxide detectors- These detectors need to  be installed on the appropriate level of your home. Replace batteries at least once a year. 13. Skin cancer- When out in the sun please cover up and use sunscreen 15 SPF or higher. 14. Violence- If anyone is threatening or hurting you, please tell your healthcare provider.        

## 2015-08-04 NOTE — Progress Notes (Signed)
Subjective:    Patient ID: Cheryl Hale, female    DOB: August 30, 1980, 35 y.o.   MRN: [id number]  HPI Presents for annual wellness exam.  She's feeling well, has recently visited [place] and is planning to move there this winter. She is excited and nervous, and very happy.   Cervical Cancer Screening: 01/2015. Normal cytology and NEG HPV. Repeat both in 5 years. Breast Cancer Screening: SBE monthly. CBE annually. Colorectal Cancer Screening: not yet a candidate. Bone Density Testing: not yet a candidate. HIV Screening: complete STI Screening: very low risk Seasonal Influenza Vaccination: current Td/Tdap Vaccination: current 08/2009. Repeat in 2021. Pneumococcal Vaccination: not yet a candidate Zoster Vaccination: Not yet a candidate Frequency of Dental evaluation: Q6 months Frequency of Eye evaluation: Annually   Allergies  Allergen Reactions  . Sulfa Antibiotics Other (See Comments)    childhood    Prior to Admission medications   Medication Sig Start Date End Date Taking? Authorizing Provider  ALPRAZolam Duanne Moron) 0.5 MG tablet Take 1 tablet (0.5 mg total) by mouth as needed for anxiety. 06/01/15  Yes Kaelah Hayashi, PA-C  clobetasol ointment (TEMOVATE) AB-123456789 % Apply 1 application topically 2 (two) times daily as needed.   Yes Historical Provider, MD  drospirenone-ethinyl estradiol (VESTURA) 3-0.02 MG tablet TAKE 1 TABLET BY MOUTH DAILY 02/10/15  Yes Hashem Goynes, PA-C  mometasone (NASONEX) 50 MCG/ACT nasal spray Place 2 sprays into the nose daily.   Yes Historical Provider, MD  venlafaxine XR (EFFEXOR-XR) 75 MG 24 hr capsule Start with 1 daily. After 1 week, increase to 2 daily. 06/01/15  Yes Harrison Mons, PA-C     Patient Active Problem List   Diagnosis Date Noted  . BMI 25.0-25.9,adult 05/27/2014  . Anxiety 06/06/2013  . Smoker 12/18/2012  . 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 Allergic rhinitis 08/22/2012  . Benign neoplasm of skin 08/22/2012  . Menstrual molimen  08/22/2012  . 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 Allergy   . Anxiety   . Blood transfusion without reported diagnosis   . Tobacco abuse   . Pyloric stenosis, congenital   . Clotting disorder (HCC)     ITP     Family History  Problem Relation Age of Onset  . Hypertension Mother   . Hyperlipidemia Mother   . Diabetes Father   . Cancer Maternal Grandfather   . Hypertension Maternal Grandfather   . Diabetes Maternal Grandfather   . Cancer Maternal Grandmother   . Cancer Paternal Grandfather     lymphoma  . Heart disease Paternal Grandfather   . Diabetes Paternal Grandfather   . Hyperlipidemia Brother   . Diabetes Brother      Social History   Social History  . Marital Status: Single    Spouse Name: n/a  . Number of Children: 0  . Years of Education: college   Occupational History  . HR Benefit Administrator [employer]   Social History Main Topics  . Smoking status: Current Every Day Smoker -- 0.30 packs/day    Types: Cigarettes  . Smokeless tobacco: Never Used  . Alcohol Use: 1.8 oz/week    3 Standard drinks or equivalent per week  . Drug Use: No  . Sexual Activity: Not Currently   Other Topics Concern  . Not on file   Social History Narrative   Education: [employer]. Lives alone. Exercise: kickbox 1-2 hours 4-5 times a week.      Review of  Systems  Constitutional: Negative.   HENT: Negative.   Eyes: Negative.   Respiratory: Negative.   Cardiovascular: Negative.   Gastrointestinal: Negative.   Endocrine: Negative.   Genitourinary: Negative.   Musculoskeletal: Negative.   Skin: Positive for rash (similar to previous rash on her hands. Using same steroid cream). Negative for color change, pallor and wound.  Allergic/Immunologic: Negative.   Neurological: Negative.   Hematological: Negative.   Psychiatric/Behavioral: Negative.        Objective:   Physical Exam  Constitutional: She is oriented  to person, place, and time. Vital signs are normal. She appears well-developed and well-nourished. She is active and cooperative. No distress.  BP 96/64 mmHg  Pulse 63  Temp(Src) 98.7 F (37.1 C) (Oral)  Resp 16  Ht 5\' 7"  (1.702 m)  Wt 161 lb 3.2 oz (73.12 kg)  BMI 25.24 kg/m2  SpO2 96%  LMP 07/17/2015   HENT:  Head: Normocephalic and atraumatic.  Right Ear: Hearing, tympanic membrane, external ear and ear canal normal. No foreign bodies.  Left Ear: Hearing, tympanic membrane, external ear and ear canal normal. No foreign bodies.  Nose: Nose normal.  Mouth/Throat: Uvula is midline, oropharynx is clear and moist and mucous membranes are normal. No oral lesions. Normal dentition. No dental abscesses or uvula swelling. No oropharyngeal exudate.  Eyes: Conjunctivae, EOM and lids are normal. Pupils are equal, round, and reactive to light. Right eye exhibits no discharge. Left eye exhibits no discharge. No scleral icterus.  Fundoscopic exam:      The right eye shows no arteriolar narrowing, no AV nicking, no exudate, no hemorrhage and no papilledema. The right eye shows red reflex.       The left eye shows no arteriolar narrowing, no AV nicking, no exudate, no hemorrhage and no papilledema. The left eye shows red reflex.  Neck: Trachea normal, normal range of motion and full passive range of motion without pain. Neck supple. No spinous process tenderness and no muscular tenderness present. No thyroid mass and no thyromegaly present.  Cardiovascular: Normal rate, regular rhythm, normal heart sounds, intact distal pulses and normal pulses.   Pulmonary/Chest: Effort normal and breath sounds normal. Right breast exhibits no inverted nipple, no mass, no nipple discharge, no skin change and no tenderness. Left breast exhibits no inverted nipple, no mass, no nipple discharge, no skin change and no tenderness. Breasts are symmetrical.  Musculoskeletal: She exhibits no edema or tenderness.       Cervical  back: Normal.       Thoracic back: Normal.       Lumbar back: Normal.  Lymphadenopathy:       Head (right side): No tonsillar, no preauricular, no posterior auricular and no occipital adenopathy present.       Head (left side): No tonsillar, no preauricular, no posterior auricular and no occipital adenopathy present.    She has no cervical adenopathy.       Right: No supraclavicular adenopathy present.       Left: No supraclavicular adenopathy present.  Neurological: She is alert and oriented to person, place, and time. She has normal strength and normal reflexes. No cranial nerve deficit. She exhibits normal muscle tone. Coordination and gait normal.  Skin: Skin is warm, dry and intact. Rash (dishydrotic-type rash, medial RIGHT heel) noted. She is not diaphoretic. No cyanosis or erythema. Nails show no clubbing.  Psychiatric: She has a normal mood and affect. Her speech is normal and behavior is normal. Judgment and thought content  normal.          Assessment & Plan:   1. Annual physical exam Age appropriate anticipatory guidance provided.  2. Benign neoplasm of skin Continue prn steroid cream. - clobetasol ointment (TEMOVATE) 0.05 %; Apply 1 application topically 2 (two) times daily as needed.  Dispense: 60 g; Refill: 1  3. Anxiety Stable. Continue venlafaxine.  4. Encounter for contraceptive management, unspecified encounter Happy with current method. Continue pills.  5. Family history of diabetes mellitus in brother - Comprehensive metabolic panel  6. Screening for hyperlipidemia - Lipid panel   Return in about 6 months (around 02/03/2016).   Fara Chute, PA-C Physician Assistant-Certified [facility]

## 2015-09-11 ENCOUNTER — Encounter: Payer: Self-pay | Admitting: Physician Assistant

## 2015-09-11 DIAGNOSIS — F419 Anxiety disorder, unspecified: Secondary | ICD-10-CM

## 2015-09-15 MED ORDER — ALPRAZOLAM 0.5 MG PO TABS: 0.5000 mg | ORAL_TABLET | ORAL | Status: DC | PRN

## 2015-09-15 NOTE — Telephone Encounter (Signed)
Patient notified via My Chart.  Meds ordered this encounter  Medications  . ALPRAZolam (XANAX) 0.5 MG tablet    Sig: Take 1 tablet (0.5 mg total) by mouth as needed for anxiety.    Dispense:  60 tablet    Refill:  0    Order Specific Question:  Supervising Provider    Answer:  Brigitte Pulse, EVA N [[id number]]

## 2015-09-17 ENCOUNTER — Other Ambulatory Visit: Payer: Self-pay

## 2015-09-17 DIAGNOSIS — F419 Anxiety disorder, unspecified: Secondary | ICD-10-CM

## 2015-09-17 NOTE — Telephone Encounter (Signed)
Pharmacy called for clarification on sig for alprazolam. It reads: Take 1 tablet (0.5 mg total) by mouth as needed for anxiety. When written this way pt can only get the #60 filled every two months. [name], did you intend for pt to be able to take this twice a day if needed? I will pend that way for you. I checked all the way back in [facility] and it has always been ordered with the same sig, and I don't see anything in any notes that specify, but with the quantity it looks like that is what you intended.

## 2015-09-18 MED ORDER — ALPRAZOLAM 0.5 MG PO TABS: 0.5000 mg | ORAL_TABLET | Freq: Two times a day (BID) | ORAL | Status: DC | PRN

## 2015-09-18 NOTE — Telephone Encounter (Signed)
Meds ordered this encounter  Medications  . ALPRAZolam (XANAX) 0.5 MG tablet    Sig: Take 1 tablet (0.5 mg total) by mouth 2 (two) times daily as needed for anxiety.    Dispense:  60 tablet    Refill:  0    Order Specific Question:  Supervising Provider    Answer:  Brigitte Pulse, EVA N [[id number]]

## 2015-09-21 NOTE — Telephone Encounter (Signed)
Faxed new Rx to pharm.

## 2015-11-10 ENCOUNTER — Encounter: Payer: Self-pay | Admitting: Physician Assistant

## 2015-11-10 ENCOUNTER — Ambulatory Visit (INDEPENDENT_AMBULATORY_CARE_PROVIDER_SITE_OTHER): Payer: 59 | Admitting: Physician Assistant

## 2015-11-10 VITALS — BP 108/68 | HR 90 | Temp 98.5°F | Ht 66.0 in | Wt 171.0 lb

## 2015-11-10 DIAGNOSIS — D239 Other benign neoplasm of skin, unspecified: Secondary | ICD-10-CM | POA: Diagnosis not present

## 2015-11-10 DIAGNOSIS — F419 Anxiety disorder, unspecified: Secondary | ICD-10-CM | POA: Diagnosis not present

## 2015-11-10 DIAGNOSIS — Z789 Other specified health status: Secondary | ICD-10-CM

## 2015-11-10 DIAGNOSIS — Z309 Encounter for contraceptive management, unspecified: Secondary | ICD-10-CM | POA: Diagnosis not present

## 2015-11-10 DIAGNOSIS — Z23 Encounter for immunization: Secondary | ICD-10-CM | POA: Diagnosis not present

## 2015-11-10 DIAGNOSIS — J309 Allergic rhinitis, unspecified: Secondary | ICD-10-CM

## 2015-11-10 DIAGNOSIS — Z760 Encounter for issue of repeat prescription: Secondary | ICD-10-CM | POA: Diagnosis not present

## 2015-11-10 MED ORDER — DROSPIRENONE-ETHINYL ESTRADIOL 3-0.02 MG PO TABS: ORAL_TABLET | ORAL | 1 refills | Status: DC

## 2015-11-10 MED ORDER — ALPRAZOLAM 0.5 MG PO TABS: 0.5000 mg | ORAL_TABLET | Freq: Three times a day (TID) | ORAL | 0 refills | Status: DC | PRN

## 2015-11-10 MED ORDER — VENLAFAXINE HCL ER 75 MG PO CP24: 75.0000 mg | ORAL_CAPSULE | Freq: Every day | ORAL | 1 refills | Status: DC

## 2015-11-10 MED ORDER — CLOBETASOL PROPIONATE 0.05 % EX OINT: 1.0000 "application " | TOPICAL_OINTMENT | Freq: Two times a day (BID) | CUTANEOUS | 1 refills | Status: AC | PRN

## 2015-11-10 MED ORDER — MOMETASONE FUROATE 50 MCG/ACT NA SUSP: 2.0000 | Freq: Every day | NASAL | 1 refills | Status: DC

## 2015-11-10 NOTE — Progress Notes (Signed)
Patient ID: Cheryl Hale, female    DOB: 1980/07/10, 35 y.o.   MRN: [id number]  PCP: Harrison Mons, PA-C  Subjective:   Chief Complaint  Patient presents with  . Follow-up    AFTER CPE -PER PATIENT CONSULT ABOUT IUD  . Medication Refill    ALL RXs     HPI Presents for prescription refills.  Still planning to move to [place] 12/09/2015. Has put in her notice at work. Is excited and nervous. Is interested in knowing options for contraception, as she does not think she will have health insurance initially.  She is tolerating all her medications without adverse effects.    Review of Systems No chest pain, SOB, HA, dizziness, vision change, N/V, diarrhea, constipation, dysuria, urinary urgency or frequency, myalgias, arthralgias or rash.     Patient Active Problem List   Diagnosis Date Noted  . BMI 25.0-25.9,adult 05/27/2014  . Anxiety 06/06/2013  . Smoker 12/18/2012  . 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 Allergic rhinitis 08/22/2012  . Benign neoplasm of skin 08/22/2012  . Menstrual molimen 08/22/2012  . 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 Authorizing Provider  ALPRAZolam Duanne Moron) 0.5 MG tablet Take 1 tablet (0.5 mg total) by mouth 2 (two) times daily as needed for anxiety. 09/18/15  Yes Tytianna Greenley, PA-C  clobetasol ointment (TEMOVATE) AB-123456789 % Apply 1 application topically 2 (two) times daily as needed. 08/04/15  Yes Kush Farabee, PA-C  drospirenone-ethinyl estradiol (VESTURA) 3-0.02 MG tablet TAKE 1 TABLET BY MOUTH DAILY 02/10/15  Yes Skye Rodarte, PA-C  venlafaxine XR (EFFEXOR-XR) 75 MG 24 hr capsule Start with 1 daily. After 1 week, increase to 2 daily. 06/01/15  Yes Ainslie Mazurek, PA-C  mometasone (NASONEX) 50 MCG/ACT nasal spray Place 2 sprays into the nose daily.    Historical Provider, MD     Allergies  Allergen Reactions  . Sulfa Antibiotics Other (See Comments)    childhood         Objective:  Physical Exam  Constitutional: She is oriented to person, place, and time. She appears well-developed and well-nourished. She is active and cooperative. No distress.  BP 108/68 (BP Location: Right Arm, Patient Position: Sitting, Cuff Size: Normal)   Pulse 90   Temp 98.5 F (36.9 C) (Oral)   Ht 5\' 6"  (1.676 m)   Wt 171 lb (77.6 kg)   LMP 10/28/2015   BMI 27.60 kg/m   HENT:  Head: Normocephalic and atraumatic.  Right Ear: Hearing normal.  Left Ear: Hearing normal.  Eyes: Conjunctivae are normal. No scleral icterus.  Neck: Normal range of motion. Neck supple. No thyromegaly present.  Cardiovascular: Normal rate, regular rhythm and normal heart sounds.   Pulses:      Radial pulses are 2+ on the right side, and 2+ on the left side.  Pulmonary/Chest: Effort normal and breath sounds normal.  Lymphadenopathy:       Head (right side): No tonsillar, no preauricular, no posterior auricular and no occipital adenopathy present.       Head (left side): No tonsillar, no preauricular, no posterior auricular and no occipital adenopathy present.    She has no cervical adenopathy.       Right: No supraclavicular adenopathy present.       Left: No supraclavicular adenopathy present.  Neurological: She is alert and oriented to person, place, and time. No sensory deficit.  Skin: Skin is warm, dry and  intact. No rash noted. No cyanosis or erythema. Nails show no clubbing.  Psychiatric: She has a normal mood and affect. Her speech is normal and behavior is normal.           Assessment & Plan:   1. Medication refill Will ask that her pharmacy dispense a 53-month supply of her medications to allow her to transition to her her situation. If she elects not to establish for care where she is, I'm will to prescribe x 2 years. She expects to return for visits and could come in for evaluation then.  2. Uses birth control Discussed LARC methods, but she'd prefer to continue COC.  Continue her current one for now, but when she loses insurance, can change to [place]. She can explore options for purchasing the pills in [facility], or her mother could fill the prescription here and mail it to her.  - drospirenone-ethinyl estradiol (VESTURA) 3-0.02 MG tablet; TAKE 1 TABLET BY MOUTH DAILY  Dispense: 178 tablet; Refill: 1  3. Anxiety Controlled. Anticipate improvement once she is settled in her new situation. - venlafaxine XR (EFFEXOR-XR) 75 MG 24 hr capsule; Take 1 capsule (75 mg total) by mouth daily with breakfast.  Dispense: 180 capsule; Refill: 1 - ALPRAZolam (XANAX) 0.5 MG tablet; Take 1 tablet (0.5 mg total) by mouth 3 (three) times daily as needed for anxiety.  Dispense: 90 tablet; Refill: 0  4. Benign neoplasm of skin Stable. Continue PRN steroid cream. - clobetasol ointment (TEMOVATE) 0.05 %; Apply 1 application topically 2 (two) times daily as needed.  Dispense: 180 g; Refill: 1  5. Allergic rhinitis, unspecified allergic rhinitis type Stable. Continue. - mometasone (NASONEX) 50 MCG/ACT nasal spray; Place 2 sprays into the nose daily.  Dispense: 51 g; Refill: 1  6. Need for prophylactic vaccination and inoculation against influenza - Flu Vaccine QUAD 36+ mos IM   Fara Chute, PA-C Physician Assistant-Certified [facility]

## 2015-11-10 NOTE — Patient Instructions (Addendum)
Have an AWESOME time!!!!!    IF you received an x-ray today, you will receive an invoice from [facility]. Please contact [facility] at [phone] with questions or concerns regarding your invoice.   IF you received labwork today, you will receive an invoice from [facility]. Please contact [facility] at [phone] with questions or concerns regarding your invoice.   Our billing staff will not be able to assist you with questions regarding bills from these companies.  You will be contacted with the lab results as soon as they are available. The fastest way to get your results is to activate your My Chart account. Instructions are located on the last page of this paperwork. If you have not heard from [place] regarding the results in 2 weeks, please contact this office.

## 2015-12-01 ENCOUNTER — Ambulatory Visit: Payer: 59 | Admitting: Physician Assistant

## 2016-04-23 ENCOUNTER — Encounter: Payer: Self-pay | Admitting: Physician Assistant

## 2016-06-30 ENCOUNTER — Other Ambulatory Visit: Payer: Self-pay | Admitting: Physician Assistant

## 2016-06-30 DIAGNOSIS — F419 Anxiety disorder, unspecified: Secondary | ICD-10-CM

## 2016-06-30 NOTE — Telephone Encounter (Signed)
Meds ordered this encounter  Medications  . ALPRAZolam (XANAX) 0.5 MG tablet    Sig: TAKE 1 TABLET BY MOUTH THREE TIMES DAILY AS NEEDED    Dispense:  90 tablet    Refill:  0

## 2016-06-30 NOTE — Telephone Encounter (Signed)
Called to pharmacy 

## 2016-08-23 ENCOUNTER — Other Ambulatory Visit: Payer: Self-pay | Admitting: Physician Assistant

## 2016-08-23 DIAGNOSIS — Z789 Other specified health status: Secondary | ICD-10-CM

## 2016-10-15 ENCOUNTER — Other Ambulatory Visit: Payer: Self-pay | Admitting: Physician Assistant

## 2016-10-15 DIAGNOSIS — F419 Anxiety disorder, unspecified: Secondary | ICD-10-CM

## 2016-10-17 NOTE — Telephone Encounter (Signed)
Please advise. Is this [place] the country?

## 2016-12-02 ENCOUNTER — Encounter: Payer: Self-pay | Admitting: Physician Assistant

## 2016-12-02 ENCOUNTER — Ambulatory Visit (INDEPENDENT_AMBULATORY_CARE_PROVIDER_SITE_OTHER): Payer: 59 | Admitting: Physician Assistant

## 2016-12-02 VITALS — BP 114/78 | HR 79 | Resp 16 | Ht 66.5 in | Wt 172.4 lb

## 2016-12-02 DIAGNOSIS — Z13228 Encounter for screening for other metabolic disorders: Secondary | ICD-10-CM

## 2016-12-02 DIAGNOSIS — Z13220 Encounter for screening for lipoid disorders: Secondary | ICD-10-CM | POA: Diagnosis not present

## 2016-12-02 DIAGNOSIS — Z0000 Encounter for general adult medical examination without abnormal findings: Secondary | ICD-10-CM | POA: Diagnosis not present

## 2016-12-02 DIAGNOSIS — L309 Dermatitis, unspecified: Secondary | ICD-10-CM | POA: Diagnosis not present

## 2016-12-02 DIAGNOSIS — Z23 Encounter for immunization: Secondary | ICD-10-CM | POA: Diagnosis not present

## 2016-12-02 DIAGNOSIS — Z113 Encounter for screening for infections with a predominantly sexual mode of transmission: Secondary | ICD-10-CM | POA: Diagnosis not present

## 2016-12-02 DIAGNOSIS — J3089 Other allergic rhinitis: Secondary | ICD-10-CM

## 2016-12-02 DIAGNOSIS — B360 Pityriasis versicolor: Secondary | ICD-10-CM | POA: Diagnosis not present

## 2016-12-02 DIAGNOSIS — Z309 Encounter for contraceptive management, unspecified: Secondary | ICD-10-CM

## 2016-12-02 DIAGNOSIS — Z130 Encounter for screening for diseases of the blood and blood-forming organs and certain disorders involving the immune mechanism: Secondary | ICD-10-CM

## 2016-12-02 DIAGNOSIS — Z789 Other specified health status: Secondary | ICD-10-CM

## 2016-12-02 DIAGNOSIS — F419 Anxiety disorder, unspecified: Secondary | ICD-10-CM

## 2016-12-02 DIAGNOSIS — Z1389 Encounter for screening for other disorder: Secondary | ICD-10-CM

## 2016-12-02 MED ORDER — DROSPIRENONE-ETHINYL ESTRADIOL 3-0.02 MG PO TABS: 1.0000 | ORAL_TABLET | Freq: Every day | ORAL | 99 refills | Status: DC

## 2016-12-02 MED ORDER — ALPRAZOLAM 0.5 MG PO TABS: 0.5000 mg | ORAL_TABLET | Freq: Three times a day (TID) | ORAL | 0 refills | Status: DC | PRN

## 2016-12-02 MED ORDER — MOMETASONE FUROATE 50 MCG/ACT NA SUSP: 2.0000 | Freq: Every day | NASAL | 99 refills | Status: AC

## 2016-12-02 MED ORDER — VENLAFAXINE HCL ER 75 MG PO CP24: ORAL_CAPSULE | ORAL | 1 refills | Status: DC

## 2016-12-02 MED ORDER — KETOCONAZOLE 2 % EX CREA: 1.0000 "application " | TOPICAL_CREAM | Freq: Every day | CUTANEOUS | 1 refills | Status: AC

## 2016-12-02 NOTE — Progress Notes (Signed)
Subjective:    Patient ID: Cheryl Hale, female    DOB: [birth year], 36 y.o.   MRN: [id number]  HPI  Chief Complaint  Patient presents with  . Annual Exam   Patient returns today for an annual physical exam. Overall, the patient states she is doing well and is very happy with her move to [place]. She states she has no plans on coming back. She quit smoking 6 months ago and has been doing well with it.   Patient has a history of dry and cracking skin on her palms, which resolved but now she has it on the soles of her feet that began about a month after moving to [place]. She denies any itching, but it is painful when the dry skin cracks. She uses the Clobetasol ointment as needed for relief.   She states that she rarely ever uses the Xanax as the Effexor is managing her symptoms well.   Reports daily BM of normal caliber for her.  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 Breakfast: Eggs Lunch: Salad, take out Dinner: skips dinner, sandwich  Tries [place] get [place] in [place] diet[place]   Last dental exam: August 2017 Last eye exam: August 2017 Last pap smear: 01/2015  Review of Systems  Constitutional: Negative for appetite change, chills, fatigue, fever and unexpected weight change.  HENT: Negative for congestion, ear pain, hearing loss, mouth sores, rhinorrhea, sinus pain, sinus pressure, sneezing, sore throat, tinnitus and trouble swallowing.   Eyes: Negative.  Negative for visual disturbance.  Respiratory: Negative for cough, chest tightness, shortness of breath and wheezing.   Cardiovascular: Negative for chest pain, palpitations and leg swelling.  Gastrointestinal: Negative for abdominal pain, blood in stool, constipation, diarrhea, nausea and vomiting.  Genitourinary: Negative.   Musculoskeletal: Negative for arthralgias and myalgias.  Neurological: Negative for dizziness, syncope,  light-headedness and headaches.  Psychiatric/Behavioral: Negative for decreased concentration and sleep disturbance. The patient is nervous/anxious.    Patient Active Problem List   Diagnosis Date Noted  . BMI 25.0-25.9,adult 05/27/2014  . Anxiety 06/06/2013  . Smoker 12/18/2012  . 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 Allergic rhinitis 08/22/2012  . Benign neoplasm of skin 08/22/2012  . Menstrual molimen 08/22/2012  . 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 Authorizing Provider  ALPRAZolam Duanne Moron) 0.5 MG tablet TAKE 1 TABLET BY MOUTH THREE TIMES DAILY AS NEEDED 06/30/16  Yes Jeffery, [name], PA-C  clobetasol ointment (TEMOVATE) 5.78 % Apply 1 application topically 2 (two) times daily as needed. 11/10/15  Yes Jeffery, [name], PA-C  LORYNA 3-0.02 MG tablet TAKE 1 TABLET BY MOUTH DAILY 08/24/16  Yes Jeffery, [name], PA-C  mometasone (NASONEX) 50 MCG/ACT nasal spray Place 2 sprays into the nose daily. 11/10/15  Yes Jeffery, [name], PA-C  venlafaxine XR (EFFEXOR-XR) 75 MG 24 hr capsule TAKE ONE CAPSULE BY MOUTH DAILY WITH BREAKFAST 10/17/16  Yes Jeffery, [name], PA-C   Allergies  Allergen Reactions  . Sulfa Antibiotics Other (See Comments)    childhood   Social History   Social History  . Marital status: Single    Spouse name: n/a  . Number of children: 0  . Years of education: college   Occupational History  . 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 Smoking status: Former Smoker  Packs/day: 0.30    Types: Cigarettes    Quit date: 06/15/2015  . Smokeless tobacco: Never Used  . Alcohol use 1.8 oz/week    3 Standard drinks or equivalent per week  . Drug use: No  . Sexual activity: Yes    Birth control/ protection: Pill, Condom   Other Topics Concern  . Not on file   Social History Narrative   Education: [employer]. Lives alone. Exercise: kickbox 1-2 hours 4-5 times a  week.   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 Has no plans on coming back.         Objective:   Physical Exam  Constitutional: She is oriented to person, place, and time. She appears well-developed and well-nourished. No distress.  HENT:  Head: Normocephalic and atraumatic.  Right Ear: External ear normal.  Left Ear: External ear normal.  Neck: Neck supple. No JVD present. No tracheal deviation present. No thyromegaly present.  Cardiovascular: Normal rate, regular rhythm, normal heart sounds and intact distal pulses.  Exam reveals no gallop and no friction rub.   No murmur heard. Pulmonary/Chest: Effort normal and breath sounds normal. No stridor. No respiratory distress. She has no wheezes. She has no rales. She exhibits no tenderness.  Abdominal: Soft. Bowel sounds are normal. She exhibits no distension and no mass. There is no tenderness. There is no rebound and no guarding.  Lymphadenopathy:    She has no cervical adenopathy.  Neurological: She is alert and oriented to person, place, and time. She has normal reflexes. No cranial nerve deficit.  Skin: Skin is warm and dry. No rash noted. She is not diaphoretic. No erythema. No pallor.  Psychiatric: She has a normal mood and affect. Her behavior is normal. Judgment and thought content normal.      Assessment & Plan:  1. Annual physical exam  2. Need for influenza vaccination - Completed today in clinic - Flu Vaccine QUAD 36+ mos IM - Care order/instruction:  3. Screening for deficiency anemia - CBC with Differential/Platelet  4. Screening for metabolic disorder - Comprehensive metabolic panel  5. Screening for hyperlipidemia - Lipid panel  6. Screening for blood or protein in urine - Urinalysis, dipstick only  7. Routine screening for STI (sexually transmitted infection) - GC/Chlamydia Probe Amp - HIV antibody - RPR - Trichomonas vaginalis, RNA  8.  Dermatitis - ketoconazole (NIZORAL) 2 % cream; Apply 1 application topically daily.  Dispense: 120 g; Refill: 1  9. Tinea versicolor - Continue with Clobetasol cream as needed   10. Anxiety - venlafaxine XR (EFFEXOR-XR) 75 MG 24 hr capsule; TAKE ONE CAPSULE BY MOUTH DAILY WITH BREAKFAST  Dispense: 180 capsule; Refill: 1 - ALPRAZolam (XANAX) 0.5 MG tablet; Take 1 tablet (0.5 mg total) by mouth 3 (three) times daily as needed.  Dispense: 90 tablet; Refill: 0  11. Uses birth control - drospirenone-ethinyl estradiol (LORYNA) 3-0.02 MG tablet; Take 1 tablet by mouth daily.  Dispense: 168 tablet; Refill: prn  12. Environmental and seasonal allergies - mometasone (NASONEX) 50 MCG/ACT nasal spray; Place 2 sprays into the nose daily.  Dispense: 51 g; Refill: prn  Respectfully, Denny Levy PA-S 2019

## 2016-12-02 NOTE — Patient Instructions (Addendum)
   IF you received an x-ray today, you will receive an invoice from [facility]. Please contact [facility] at [phone] with questions or concerns regarding your invoice.   IF you received labwork today, you will receive an invoice from [facility]. Please contact [facility] at [phone] with questions or concerns regarding your invoice.   Our billing staff will not be able to assist you with questions regarding bills from these companies.  You will be contacted with the lab results as soon as they are available. The fastest way to get your results is to activate your My Chart account. Instructions are located on the last page of this paperwork. If you have not heard from us regarding the results in 2 weeks, please contact this office.     Preventive Care 18-39 Years, Female Preventive care refers to lifestyle choices and visits with your health care provider that can promote health and wellness. What does preventive care include?  A yearly physical exam. This is also called an annual well check.  Dental exams once or twice a year.  Routine eye exams. Ask your health care provider how often you should have your eyes checked.  Personal lifestyle choices, including: ? Daily care of your teeth and gums. ? Regular physical activity. ? Eating a healthy diet. ? Avoiding tobacco and drug use. ? Limiting alcohol use. ? Practicing safe sex. ? Taking vitamin and mineral supplements as recommended by your health care provider. What happens during an annual well check? The services and screenings done by your health care provider during your annual well check will depend on your age, overall health, lifestyle risk factors, and family history of disease. Counseling Your health care provider may ask you questions about your:  Alcohol use.  Tobacco use.  Drug use.  Emotional well-being.  Home and relationship well-being.  Sexual activity.  Eating habits.  Work  and work environment.  Method of birth control.  Menstrual cycle.  Pregnancy history.  Screening You may have the following tests or measurements:  Height, weight, and BMI.  Diabetes screening. This is done by checking your blood sugar (glucose) after you have not eaten for a while (fasting).  Blood pressure.  Lipid and cholesterol levels. These may be checked every 5 years starting at age 20.  Skin check.  Hepatitis C blood test.  Hepatitis B blood test.  Sexually transmitted disease (STD) testing.  BRCA-related cancer screening. This may be done if you have a family history of breast, ovarian, tubal, or peritoneal cancers.  Pelvic exam and Pap test. This may be done every 3 years starting at age 21. Starting at age 30, this may be done every 5 years if you have a Pap test in combination with an HPV test.  Discuss your test results, treatment options, and if necessary, the need for more tests with your health care provider. Vaccines Your health care provider may recommend certain vaccines, such as:  Influenza vaccine. This is recommended every year.  Tetanus, diphtheria, and acellular pertussis (Tdap, Td) vaccine. You may need a Td booster every 10 years.  Varicella vaccine. You may need this if you have not been vaccinated.  HPV vaccine. If you are 26 or younger, you may need three doses over 6 months.  Measles, mumps, and rubella (MMR) vaccine. You may need at least one dose of MMR. You may also need a second dose.  Pneumococcal 13-valent conjugate (PCV13) vaccine. You may need this if you have certain conditions and   were not previously vaccinated.  Pneumococcal polysaccharide (PPSV23) vaccine. You may need one or two doses if you smoke cigarettes or if you have certain conditions.  Meningococcal vaccine. One dose is recommended if you are age 19-21 years and a first-year college student living in a residence hall, or if you have one of several medical conditions.  You may also need additional booster doses.  Hepatitis A vaccine. You may need this if you have certain conditions or if you travel or work in places where you may be exposed to hepatitis A.  Hepatitis B vaccine. You may need this if you have certain conditions or if you travel or work in places where you may be exposed to hepatitis B.  Haemophilus influenzae type b (Hib) vaccine. You may need this if you have certain risk factors.  Talk to your health care provider about which screenings and vaccines you need and how often you need them. This information is not intended to replace advice given to you by your health care provider. Make sure you discuss any questions you have with your health care provider. Document Released: 04/12/2001 Document Revised: 11/04/2015 Document Reviewed: 12/16/2014 Elsevier Interactive Patient Education  2017 Elsevier Inc.  

## 2016-12-02 NOTE — Progress Notes (Signed)
Patient ID: Cheryl Hale, female    DOB: May 23, 1980, 36 y.o.   MRN: [id number]  PCP: Harrison Mons, PA-C  Chief Complaint  Patient presents with  . Annual Exam    Subjective:   Presents for [employer].  Continues to love living in [place]. Good group of friends. A boyfriend is there 6 months of the year. Effexor is adequate to manage her mood and she rarely uses the alprazolam any more. She QUIT SMOKING!!!!  Reports daily BM of normal caliber for her.  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 Breakfast: Eggs Lunch: Salad, take out Dinner: skips dinner, sandwich  [place] get [place] in [place] diet[place] .  Cervical Cancer Screening: 01/2015, normal cytology and negative HPV. Repeat in 2021. Breast Cancer Screening: Annual CBE. Not yet a candidate for mammography. Colorectal Cancer Screening: not yet a candidate. Bone Density Testing: Not yet a candidate. HIV Screening: last done in 2011. NEGATIVE. STI Screening: desires today. Seasonal Influenza Vaccination: today. Td/Tdap Vaccination: 08/2009. Pneumococcal Vaccination: not yet a candidate. Zoster Vaccination: not yet a candidate Frequency of Dental evaluation: 6 months Frequency of Eye evaluation: annually.    Patient Active Problem List   Diagnosis Date Noted  . BMI 25.0-25.9,adult 05/27/2014  . Anxiety 06/06/2013  . Smoker 12/18/2012  . 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 Allergic rhinitis 08/22/2012  . Benign neoplasm of skin 08/22/2012  . Menstrual molimen 08/22/2012  . 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 Allergy   . Anxiety   . Blood transfusion without reported diagnosis   . Clotting disorder (HCC)    ITP  . Pyloric stenosis, congenital   . Tobacco abuse      Prior to Admission medications   Medication Sig Start Date End Date Taking? Authorizing Provider    ALPRAZolam Duanne Moron) 0.5 MG tablet TAKE 1 TABLET BY MOUTH THREE TIMES DAILY AS NEEDED 06/30/16  Yes Kara Mierzejewski, PA-C  clobetasol ointment (TEMOVATE) 9.93 % Apply 1 application topically 2 (two) times daily as needed. 11/10/15  Yes Clairissa Valvano, PA-C  LORYNA 3-0.02 MG tablet TAKE 1 TABLET BY MOUTH DAILY 08/24/16  Yes Amila Callies, PA-C  mometasone (NASONEX) 50 MCG/ACT nasal spray Place 2 sprays into the nose daily. 11/10/15  Yes Abriel Geesey, PA-C  venlafaxine XR (EFFEXOR-XR) 75 MG 24 hr capsule TAKE ONE CAPSULE BY MOUTH DAILY WITH BREAKFAST 10/17/16  Yes Muneer Leider, PA-C    Allergies  Allergen Reactions  . Sulfa Antibiotics Other (See Comments)    childhood    Past Surgical History:  Procedure Laterality Date  . ABDOMINAL SURGERY  1980   pyloric stenosis    Family History  Problem Relation Age of Onset  . Hypertension Mother   . Hyperlipidemia Mother   . Diabetes Father 37  . Cancer Maternal Grandfather   . Hypertension Maternal Grandfather   . Diabetes Maternal Grandfather   . Cancer Maternal Grandmother   . Cancer Paternal Grandfather        lymphoma  . Heart disease Paternal Grandfather   . Diabetes Paternal Grandfather   . Hyperlipidemia Brother   . Diabetes Brother 67    Social History   Social History  . Marital status: Single    Spouse name: n/a  . Number of children: 0  . Years of education: college   Occupational History  .  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 Smoking status: Former Smoker    Packs/day: 0.30    Types: Cigarettes    Quit date: 06/15/2015  . Smokeless tobacco: Never Used  . Alcohol use 1.8 oz/week    3 Standard drinks or equivalent per week  . Drug use: No  . Sexual activity: Yes    Birth control/ protection: Pill, Condom   Other Topics Concern  . None   Social History Narrative   Education: [employer]. Lives alone. Exercise: kickbox 1-2 hours 4-5 times a week.   Lives in [place] working 3 jobs, bartending, Equities trader, and works at [employer]. Has no plans on coming back.        Review of Systems Constitutional: Negative for appetite change, chills, fatigue, fever and unexpected weight change.  HENT: Negative for congestion, ear pain, hearing loss, mouth sores, rhinorrhea, sinus pain, sinus pressure, sneezing, sore throat, tinnitus and trouble swallowing.   Eyes: Negative.  Negative for visual disturbance.  Respiratory: Negative for cough, chest tightness, shortness of breath and wheezing.   Cardiovascular: Negative for chest pain, palpitations and leg swelling.  Gastrointestinal: Negative for abdominal pain, blood in stool, constipation, diarrhea, nausea and vomiting.  Genitourinary: Negative.   Musculoskeletal: Negative for arthralgias and myalgias.  Neurological: Negative for dizziness, syncope, light-headedness and headaches.  Psychiatric/Behavioral: Negative for decreased concentration and sleep disturbance. The patient is nervous/anxious.       Objective:  Physical Exam  Constitutional: She is oriented to person, place, and time. Vital signs are normal. She appears well-developed and well-nourished. She is active and cooperative. No distress.  BP 114/78 (BP Location: Right Arm, Patient Position: Sitting, Cuff Size: Normal)   Pulse 79   Resp 16   Ht 5' 6.5" (1.689 m)   Wt 172 lb 6.4 oz (78.2 kg)   LMP 11/18/2016   SpO2 98%   BMI 27.41 kg/m    HENT:  Head: Normocephalic and atraumatic.  Right Ear: Hearing, tympanic membrane, external ear and ear canal normal. No foreign bodies.  Left Ear: Hearing, tympanic membrane, external ear and ear canal normal. No foreign bodies.  Nose: Nose normal.  Mouth/Throat: Uvula is midline, oropharynx is clear and moist and mucous membranes are normal. No oral lesions. Normal dentition. No dental abscesses or uvula swelling. No oropharyngeal exudate.  Eyes: Pupils are equal, round, and reactive to light.  Conjunctivae, EOM and lids are normal. Right eye exhibits no discharge. Left eye exhibits no discharge. No scleral icterus.  Fundoscopic exam:      The right eye shows no arteriolar narrowing, no AV nicking, no exudate, no hemorrhage and no papilledema. The right eye shows red reflex.       The left eye shows no arteriolar narrowing, no AV nicking, no exudate, no hemorrhage and no papilledema. The left eye shows red reflex.  Neck: Trachea normal, normal range of motion and full passive range of motion without pain. Neck supple. No spinous process tenderness and no muscular tenderness present. No thyroid mass and no thyromegaly present.  Cardiovascular: Normal rate, regular rhythm, normal heart sounds, intact distal pulses and normal pulses.   Pulmonary/Chest: Effort normal and breath sounds normal. Right breast exhibits no inverted nipple, no mass, no nipple discharge, no skin change and no tenderness. Left breast exhibits no inverted nipple, no mass, no nipple discharge, no skin change and no tenderness. Breasts are symmetrical.  Musculoskeletal: She  exhibits no edema or tenderness.       Cervical back: Normal.       Thoracic back: Normal.       Lumbar back: Normal.  Lymphadenopathy:       Head (right side): No tonsillar, no preauricular, no posterior auricular and no occipital adenopathy present.       Head (left side): No tonsillar, no preauricular, no posterior auricular and no occipital adenopathy present.    She has no cervical adenopathy.       Right: No supraclavicular adenopathy present.       Left: No supraclavicular adenopathy present.  Neurological: She is alert and oriented to person, place, and time. She has normal strength and normal reflexes. No cranial nerve deficit. She exhibits normal muscle tone. Coordination and gait normal.  Skin: Skin is warm, dry and intact. Rash noted. She is not diaphoretic. No cyanosis or erythema. Nails show no clubbing.  Hypopigmented macules on the  upper back, shoulders and chest  Psychiatric: She has a normal mood and affect. Her speech is normal and behavior is normal. Judgment and thought content normal.        Assessment & Plan:   Problem List Items Addressed This Visit    Anxiety    Well controlled on venlafaxine.       Relevant Medications   venlafaxine XR (EFFEXOR-XR) 75 MG 24 hr capsule   ALPRAZolam (XANAX) 0.5 MG tablet    Other Visit Diagnoses    Annual physical exam    -  Primary   Age appropriate health guidance provided.   Need for influenza vaccination       Relevant Orders   Flu Vaccine QUAD 36+ mos IM (Completed)   Care order/instruction: (Completed)   Screening for deficiency anemia       Relevant Orders   CBC with Differential/Platelet (Completed)   Screening for metabolic disorder       Relevant Orders   Comprehensive metabolic panel (Completed)   Screening for hyperlipidemia       Relevant Orders   Lipid panel (Completed)   Screening for blood or protein in urine       Relevant Orders   Urinalysis, dipstick only (Completed)   Routine screening for STI (sexually transmitted infection)       Relevant Orders   GC/Chlamydia Probe Amp   HIV antibody (Completed)   RPR (Completed)   Trichomonas vaginalis, RNA (Completed)   Dermatitis       Chronic. Previously worst on the palms. Now worst on the feet.   Relevant Medications   ketoconazole (NIZORAL) 2 % cream   Tinea versicolor       Anticipatory guidance. Not interested in treatment.   Uses birth control       Relevant Medications   drospirenone-ethinyl estradiol (LORYNA) 3-0.02 MG tablet   Environmental and seasonal allergies       Relevant Medications   mometasone (NASONEX) 50 MCG/ACT nasal spray       Return in about 1 year (around 12/02/2017) for wellness exam and prescription refills.   Fara Chute, PA-C Primary [facility] at [facility]

## 2016-12-03 LAB — LIPID PANEL
CHOL/HDL RATIO: 2.2 ratio (ref 0.0–4.4)
CHOLESTEROL TOTAL: 201 mg/dL — AB (ref 100–199)
HDL: 91 mg/dL (ref >39)
LDL Calculated: 87 mg/dL (ref 0–99)
Triglycerides: 115 mg/dL (ref 0–149)
VLDL Cholesterol Cal: 23 mg/dL (ref 5–40)

## 2016-12-03 LAB — COMPREHENSIVE METABOLIC PANEL
A/G RATIO: 1.3 (ref 1.2–2.2)
ALBUMIN: 3.7 g/dL (ref 3.5–5.5)
ALK PHOS: 15 IU/L — AB (ref 39–117)
ALT: 10 IU/L (ref 0–32)
AST: 19 IU/L (ref 0–40)
BUN / CREAT RATIO: 16 (ref 9–23)
BUN: 14 mg/dL (ref 6–20)
Bilirubin Total: 0.3 mg/dL (ref 0.0–1.2)
CO2: 21 mmol/L (ref 20–29)
CREATININE: 0.86 mg/dL (ref 0.57–1.00)
Calcium: 9.2 mg/dL (ref 8.7–10.2)
Chloride: 101 mmol/L (ref 96–106)
GFR calc Af Amer: 99 mL/min/{1.73_m2} (ref >59)
GFR calc non Af Amer: 86 mL/min/{1.73_m2} (ref >59)
GLOBULIN, TOTAL: 2.9 g/dL (ref 1.5–4.5)
Glucose: 88 mg/dL (ref 65–99)
POTASSIUM: 4.4 mmol/L (ref 3.5–5.2)
SODIUM: 135 mmol/L (ref 134–144)
Total Protein: 6.6 g/dL (ref 6.0–8.5)

## 2016-12-03 LAB — URINALYSIS, DIPSTICK ONLY
BILIRUBIN UA: NEGATIVE
GLUCOSE, UA: NEGATIVE
Ketones, UA: NEGATIVE
Leukocytes, UA: NEGATIVE
Nitrite, UA: NEGATIVE
PH UA: 5.5 (ref 5.0–7.5)
PROTEIN UA: NEGATIVE
RBC UA: NEGATIVE
SPEC GRAV UA: 1.021 (ref 1.005–1.030)
UUROB: 0.2 mg/dL (ref 0.2–1.0)

## 2016-12-03 LAB — CBC WITH DIFFERENTIAL/PLATELET
BASOS: 0 %
Basophils Absolute: 0 10*3/uL (ref 0.0–0.2)
EOS (ABSOLUTE): 0.2 10*3/uL (ref 0.0–0.4)
EOS: 3 %
Hematocrit: 38.5 % (ref 34.0–46.6)
Hemoglobin: 12.8 g/dL (ref 11.1–15.9)
IMMATURE GRANS (ABS): 0 10*3/uL (ref 0.0–0.1)
IMMATURE GRANULOCYTES: 0 %
LYMPHS: 35 %
Lymphocytes Absolute: 2.5 10*3/uL (ref 0.7–3.1)
MCH: 30.7 pg (ref 26.6–33.0)
MCHC: 33.2 g/dL (ref 31.5–35.7)
MCV: 92 fL (ref 79–97)
Monocytes Absolute: 0.5 10*3/uL (ref 0.1–0.9)
Monocytes: 7 %
NEUTROS PCT: 55 %
Neutrophils Absolute: 4 10*3/uL (ref 1.4–7.0)
PLATELETS: 308 10*3/uL (ref 150–379)
RBC: 4.17 x10E6/uL (ref 3.77–5.28)
RDW: 13.7 % (ref 12.3–15.4)
WBC: 7.3 10*3/uL (ref 3.4–10.8)

## 2016-12-03 LAB — HIV ANTIBODY (ROUTINE TESTING W REFLEX): HIV SCREEN 4TH GENERATION: NONREACTIVE

## 2016-12-03 LAB — RPR: RPR: NONREACTIVE

## 2016-12-04 LAB — TRICHOMONAS VAGINALIS, PROBE AMP: Trich vag by NAA: NEGATIVE

## 2016-12-05 LAB — GC/CHLAMYDIA PROBE AMP
Chlamydia trachomatis, NAA: NEGATIVE
Neisseria gonorrhoeae by PCR: NEGATIVE

## 2016-12-05 NOTE — Assessment & Plan Note (Signed)
Well controlled on venlafaxine 

## 2016-12-07 ENCOUNTER — Encounter: Payer: Self-pay | Admitting: Physician Assistant

## 2017-06-01 ENCOUNTER — Encounter: Payer: Self-pay | Admitting: Physician Assistant

## 2017-07-13 ENCOUNTER — Other Ambulatory Visit: Payer: Self-pay | Admitting: Physician Assistant

## 2017-07-13 DIAGNOSIS — F419 Anxiety disorder, unspecified: Secondary | ICD-10-CM

## 2017-07-13 NOTE — Telephone Encounter (Signed)
Meds ordered this encounter  Medications  . ALPRAZolam (XANAX) 0.5 MG tablet    Sig: TAKE 1 TABLET BY MOUTH THREE TIMES DAILY AS NEEDED    Dispense:  90 tablet    Refill:  0    Please advise patient that I have sent the prescription, and that I will need to see her at [facility] (or she will need to establish with one of my colleagues here) for the next fill.   If she plans to follow me, she can Go ahead and call [facility] to schedule with me there. [phone].  If she plans to stay here, I recommend Dr. Nolon Rod or Dr. Pamella Pert.

## 2017-07-13 NOTE — Telephone Encounter (Signed)
Medication refill request Xanax  LOV 12/02/2016  Harrison Mons  Last fill 12/03/2016  90 tabs NR   Pharmacy on file

## 2017-11-29 ENCOUNTER — Other Ambulatory Visit: Payer: Self-pay | Admitting: Family Medicine

## 2017-11-29 DIAGNOSIS — F419 Anxiety disorder, unspecified: Secondary | ICD-10-CM

## 2017-11-29 DIAGNOSIS — Z789 Other specified health status: Secondary | ICD-10-CM

## 2017-11-29 MED ORDER — VENLAFAXINE HCL ER 75 MG PO CP24: ORAL_CAPSULE | ORAL | 0 refills | Status: DC

## 2017-11-29 MED ORDER — DROSPIRENONE-ETHINYL ESTRADIOL 3-0.02 MG PO TABS: 1.0000 | ORAL_TABLET | Freq: Every day | ORAL | 0 refills | Status: DC

## 2017-11-29 NOTE — Telephone Encounter (Signed)
Left msg for patent to call back and schedule appt. Gave 30 day courtesy refill

## 2017-11-29 NOTE — Telephone Encounter (Signed)
Copied from [place] [id number]. Topic: Quick Communication - See Telephone Encounter >> Nov 29, 2017 10:13 AM Ivar Drape wrote: CRM for notification. See Telephone encounter for: 11/29/17. Patient would like 90day refills on the following prescriptions and have them sent to [facility], Fax #: [phone]:  1) drospirenone-ethinyl estradiol (LORYNA) 3-0.02 MG tablet 2) venlafaxine XR (EFFEXOR-XR) 75 MG 24 hr capsule

## 2017-12-26 MED ORDER — DROSPIRENONE-ETHINYL ESTRADIOL 3-0.02 MG PO TABS
1.0000 | ORAL_TABLET | Freq: Every day | ORAL | 0 refills | Status: AC
Start: 2017-12-26 — End: ?

## 2017-12-26 MED ORDER — VENLAFAXINE HCL ER 75 MG PO CP24: ORAL_CAPSULE | ORAL | 0 refills | Status: AC

## 2017-12-26 NOTE — Telephone Encounter (Signed)
Patient stated she needs refills on the following: drospirenone-ethinyl estradiol (LORYNA) 3-0.02 MG tablet ( 3 weeks left) venlafaxine XR (EFFEXOR-XR) 75 MG 24 hr capsule (6 days left)   Has appointment scheduled with Dr. Brigitte Pulse on 02/15/18. Patient lives in [place] half the year and the other in the [place]. Please advise. [phone]   Pharmacy:  [facility] [phone] (Phone) [phone] (Fax)

## 2017-12-26 NOTE — Telephone Encounter (Signed)
Requested Prescriptions  Pending Prescriptions Disp Refills  . drospirenone-ethinyl estradiol (LORYNA) 3-0.02 MG tablet 84 tablet 0    Sig: Take 1 tablet by mouth daily.     OB/GYN:  Contraceptives Failed - 12/26/2017 11:46 AM      Failed - Valid encounter within last 12 months    Recent Outpatient Visits          1 year ago Annual physical exam   Primary Care at [facility], [place], [place]   2 years ago Medication refill   Primary Care at [facility], [place], [place]   2 years ago Annual physical exam   Primary Care at [place], [place], [place]   2 years ago Annual physical exam   Primary Care at [place], [place], [place]   3 years ago Skin lesion   [facility] at [facility], [place], [place]      Future Appointments            In 1 month Shawnee Knapp, MD Primary Care at [place], [place] BP in normal range    BP Readings from Last 1 Encounters:  12/02/16 114/78       . venlafaxine XR (EFFEXOR-XR) 75 MG 24 hr capsule 90 capsule 0    Sig: TAKE ONE CAPSULE BY MOUTH DAILY WITH BREAKFAST     Psychiatry: Antidepressants - SNRI - desvenlafaxine & venlafaxine Failed - 12/26/2017 11:46 AM      Failed - LDL in normal range and within 360 days    LDL Calculated  Date Value Ref Range Status  12/02/2016 87 0 - 99 mg/dL Final         Failed - Total Cholesterol in normal range and within 360 days    Cholesterol, Total  Date Value Ref Range Status  12/02/2016 201 (H) 100 - 199 mg/dL Final         Failed - Triglycerides in normal range and within 360 days    Triglycerides  Date Value Ref Range Status  12/02/2016 115 0 - 149 mg/dL Final         Failed - Valid encounter within last 6 months    Recent Outpatient Visits          1 year ago Annual physical exam   Primary Care at [facility], [place], [place]   2 years ago Medication refill   Primary Care at [facility], [place], [place]   2 years ago Annual physical exam   Primary Care at [place], [place],  [place]   2 years ago Annual physical exam   Primary Care at [place], [place], [place]   3 years ago Skin lesion   Primary Care at [place], [place], [place]      Future Appointments            In 1 month Shawnee Knapp, MD Primary Care at [place], [place] BP in normal range    BP Readings from Last 1 Encounters:  12/02/16 114/78       Signed Prescriptions Disp Refills   drospirenone-ethinyl estradiol (LORYNA) 3-0.02 MG tablet 28 tablet 0    Sig: Take 1 tablet by mouth daily.     OB/GYN:  Contraceptives Failed - 11/29/2017 10:25 AM      Failed - Valid encounter within last 12 months    Recent Outpatient Visits  1 year ago Annual physical exam   Primary Care at [facility], [place], [place]   2 years ago Medication refill   Primary Care at [facility], [place], [place]   2 years ago Annual physical exam   Primary Care at [place], [place], [place]   2 years ago Annual physical exam   Primary Care at [place], [place], [place]   3 years ago Skin lesion   Primary Care at [place], [place], [place]      Future Appointments            In 1 month Brigitte Pulse Laurey Arrow, MD Primary Care at [place], [place] BP in normal range    BP Readings from Last 1 Encounters:  12/02/16 114/78        venlafaxine XR (EFFEXOR-XR) 75 MG 24 hr capsule 30 capsule 0    Sig: TAKE ONE CAPSULE BY [place]     Psychiatry: Antidepressants - SNRI - desvenlafaxine & venlafaxine Failed - 11/29/2017 10:25 AM      Failed - LDL in normal range and within 360 days    LDL Calculated  Date Value Ref Range Status  12/02/2016 87 0 - 99 mg/dL Final         Failed - Total Cholesterol in normal range and within 360 days    Cholesterol, Total  Date Value Ref Range Status  12/02/2016 201 (H) 100 - 199 mg/dL Final         Failed - Triglycerides in normal range and within 360 days    Triglycerides  Date Value Ref Range Status  12/02/2016 115 0 - 149 mg/dL  Final         Failed - Valid encounter within last 6 months    Recent Outpatient Visits          1 year ago Annual physical exam   Primary Care at [facility], [place], [place]   2 years ago Medication refill   Primary Care at [place], [place], [place]   2 years ago Annual physical exam   Primary Care at [place], [place], [place]   2 years ago Annual physical exam   Primary Care at [place], [place], [place]   3 years ago Skin lesion   Primary [facility] at [facility], [place], [place]      Future Appointments            In 1 month Shawnee Knapp, MD Primary Care at [place], [place] BP in normal range    BP Readings from Last 1 Encounters:  12/02/16 114/78

## 2017-12-26 NOTE — Addendum Note (Signed)
Addended by: Matilde Sprang on: 12/26/2017 11:46 AM   Modules accepted: Orders

## 2018-02-15 ENCOUNTER — Ambulatory Visit: Payer: 59 | Admitting: Family Medicine
